# Patient Record
Sex: Female | Born: 1975
Health system: Southern US, Community
[De-identification: ages and names within clinical notes are randomized; demographics above are authoritative.]

## PROBLEM LIST (undated history)

## (undated) DIAGNOSIS — E119 Type 2 diabetes mellitus without complications: Secondary | ICD-10-CM

## (undated) HISTORY — PX: CHOLECYSTECTOMY: SHX55

## (undated) HISTORY — PX: TUBAL LIGATION: SHX77

---

## 1997-06-06 ENCOUNTER — Emergency Department (HOSPITAL_COMMUNITY): Admission: EM | Admit: 1997-06-06 | Discharge: 1997-06-06 | Payer: Self-pay | Admitting: Emergency Medicine

## 1997-08-14 ENCOUNTER — Emergency Department (HOSPITAL_COMMUNITY): Admission: EM | Admit: 1997-08-14 | Discharge: 1997-08-14 | Payer: Self-pay | Admitting: Emergency Medicine

## 1997-08-29 ENCOUNTER — Other Ambulatory Visit: Admission: RE | Admit: 1997-08-29 | Discharge: 1997-08-29 | Payer: Self-pay | Admitting: Obstetrics & Gynecology

## 1997-10-15 ENCOUNTER — Emergency Department (HOSPITAL_COMMUNITY): Admission: EM | Admit: 1997-10-15 | Discharge: 1997-10-15 | Payer: Self-pay | Admitting: Emergency Medicine

## 1997-10-15 ENCOUNTER — Encounter: Payer: Self-pay | Admitting: Emergency Medicine

## 1998-01-23 ENCOUNTER — Inpatient Hospital Stay (HOSPITAL_COMMUNITY): Admission: AD | Admit: 1998-01-23 | Discharge: 1998-01-23 | Payer: Self-pay | Admitting: Obstetrics and Gynecology

## 1998-02-26 ENCOUNTER — Inpatient Hospital Stay (HOSPITAL_COMMUNITY): Admission: AD | Admit: 1998-02-26 | Discharge: 1998-02-26 | Payer: Self-pay | Admitting: Obstetrics & Gynecology

## 1998-03-20 ENCOUNTER — Inpatient Hospital Stay (HOSPITAL_COMMUNITY): Admission: AD | Admit: 1998-03-20 | Discharge: 1998-03-20 | Payer: Self-pay | Admitting: Obstetrics and Gynecology

## 1998-03-23 ENCOUNTER — Inpatient Hospital Stay (HOSPITAL_COMMUNITY): Admission: AD | Admit: 1998-03-23 | Discharge: 1998-03-23 | Payer: Self-pay | Admitting: Obstetrics & Gynecology

## 1998-04-12 ENCOUNTER — Inpatient Hospital Stay (HOSPITAL_COMMUNITY): Admission: AD | Admit: 1998-04-12 | Discharge: 1998-04-12 | Payer: Self-pay | Admitting: Obstetrics & Gynecology

## 1998-04-18 ENCOUNTER — Inpatient Hospital Stay (HOSPITAL_COMMUNITY): Admission: AD | Admit: 1998-04-18 | Discharge: 1998-04-21 | Payer: Self-pay | Admitting: Obstetrics and Gynecology

## 1998-08-01 ENCOUNTER — Other Ambulatory Visit: Admission: RE | Admit: 1998-08-01 | Discharge: 1998-08-01 | Payer: Self-pay | Admitting: Obstetrics and Gynecology

## 1998-09-04 ENCOUNTER — Emergency Department (HOSPITAL_COMMUNITY): Admission: EM | Admit: 1998-09-04 | Discharge: 1998-09-04 | Payer: Self-pay | Admitting: Emergency Medicine

## 1998-12-18 ENCOUNTER — Inpatient Hospital Stay (HOSPITAL_COMMUNITY): Admission: AD | Admit: 1998-12-18 | Discharge: 1998-12-18 | Payer: Self-pay | Admitting: Obstetrics and Gynecology

## 1999-01-09 ENCOUNTER — Inpatient Hospital Stay (HOSPITAL_COMMUNITY): Admission: AD | Admit: 1999-01-09 | Discharge: 1999-01-09 | Payer: Self-pay | Admitting: Obstetrics & Gynecology

## 1999-01-20 ENCOUNTER — Inpatient Hospital Stay (HOSPITAL_COMMUNITY): Admission: AD | Admit: 1999-01-20 | Discharge: 1999-01-28 | Payer: Self-pay | Admitting: Obstetrics and Gynecology

## 1999-01-20 ENCOUNTER — Encounter (INDEPENDENT_AMBULATORY_CARE_PROVIDER_SITE_OTHER): Payer: Self-pay | Admitting: Specialist

## 1999-01-21 ENCOUNTER — Encounter: Payer: Self-pay | Admitting: Obstetrics and Gynecology

## 1999-01-31 ENCOUNTER — Inpatient Hospital Stay (HOSPITAL_COMMUNITY): Admission: AD | Admit: 1999-01-31 | Discharge: 1999-01-31 | Payer: Self-pay | Admitting: Obstetrics & Gynecology

## 2000-07-27 ENCOUNTER — Emergency Department (HOSPITAL_COMMUNITY): Admission: EM | Admit: 2000-07-27 | Discharge: 2000-07-27 | Payer: Self-pay | Admitting: Emergency Medicine

## 2000-08-26 ENCOUNTER — Emergency Department (HOSPITAL_COMMUNITY): Admission: EM | Admit: 2000-08-26 | Discharge: 2000-08-26 | Payer: Self-pay

## 2000-10-26 ENCOUNTER — Other Ambulatory Visit: Admission: RE | Admit: 2000-10-26 | Discharge: 2000-10-26 | Payer: Self-pay | Admitting: Obstetrics & Gynecology

## 2000-12-05 ENCOUNTER — Inpatient Hospital Stay (HOSPITAL_COMMUNITY): Admission: AD | Admit: 2000-12-05 | Discharge: 2000-12-05 | Payer: Self-pay | Admitting: Obstetrics and Gynecology

## 2001-02-01 ENCOUNTER — Inpatient Hospital Stay (HOSPITAL_COMMUNITY): Admission: AD | Admit: 2001-02-01 | Discharge: 2001-02-01 | Payer: Self-pay | Admitting: Obstetrics and Gynecology

## 2001-03-24 ENCOUNTER — Emergency Department (HOSPITAL_COMMUNITY): Admission: EM | Admit: 2001-03-24 | Discharge: 2001-03-24 | Payer: Self-pay | Admitting: Emergency Medicine

## 2001-04-04 ENCOUNTER — Inpatient Hospital Stay (HOSPITAL_COMMUNITY): Admission: AD | Admit: 2001-04-04 | Discharge: 2001-04-04 | Payer: Self-pay | Admitting: Obstetrics and Gynecology

## 2001-04-20 ENCOUNTER — Encounter: Payer: Self-pay | Admitting: Obstetrics and Gynecology

## 2001-04-20 ENCOUNTER — Ambulatory Visit (HOSPITAL_COMMUNITY): Admission: RE | Admit: 2001-04-20 | Discharge: 2001-04-20 | Payer: Self-pay | Admitting: Obstetrics and Gynecology

## 2001-04-23 ENCOUNTER — Inpatient Hospital Stay (HOSPITAL_COMMUNITY): Admission: AD | Admit: 2001-04-23 | Discharge: 2001-04-25 | Payer: Self-pay | Admitting: Obstetrics & Gynecology

## 2001-04-25 ENCOUNTER — Encounter: Payer: Self-pay | Admitting: Obstetrics & Gynecology

## 2001-04-28 ENCOUNTER — Inpatient Hospital Stay (HOSPITAL_COMMUNITY): Admission: AD | Admit: 2001-04-28 | Discharge: 2001-04-28 | Payer: Self-pay | Admitting: *Deleted

## 2001-05-06 ENCOUNTER — Inpatient Hospital Stay (HOSPITAL_COMMUNITY): Admission: AD | Admit: 2001-05-06 | Discharge: 2001-05-06 | Payer: Self-pay | Admitting: Obstetrics and Gynecology

## 2001-05-06 ENCOUNTER — Encounter: Payer: Self-pay | Admitting: Obstetrics and Gynecology

## 2001-05-15 ENCOUNTER — Inpatient Hospital Stay (HOSPITAL_COMMUNITY): Admission: AD | Admit: 2001-05-15 | Discharge: 2001-05-15 | Payer: Self-pay | Admitting: Obstetrics & Gynecology

## 2001-05-21 ENCOUNTER — Inpatient Hospital Stay: Admission: AD | Admit: 2001-05-21 | Discharge: 2001-05-21 | Payer: Self-pay | Admitting: Obstetrics and Gynecology

## 2001-06-04 ENCOUNTER — Inpatient Hospital Stay (HOSPITAL_COMMUNITY): Admission: AD | Admit: 2001-06-04 | Discharge: 2001-06-04 | Payer: Self-pay | Admitting: Obstetrics and Gynecology

## 2001-06-06 ENCOUNTER — Inpatient Hospital Stay (HOSPITAL_COMMUNITY): Admission: AD | Admit: 2001-06-06 | Discharge: 2001-06-09 | Payer: Self-pay | Admitting: Obstetrics and Gynecology

## 2001-06-06 ENCOUNTER — Encounter (INDEPENDENT_AMBULATORY_CARE_PROVIDER_SITE_OTHER): Payer: Self-pay

## 2002-01-16 ENCOUNTER — Inpatient Hospital Stay (HOSPITAL_COMMUNITY): Admission: AD | Admit: 2002-01-16 | Discharge: 2002-01-16 | Payer: Self-pay | Admitting: *Deleted

## 2002-08-22 ENCOUNTER — Emergency Department (HOSPITAL_COMMUNITY): Admission: EM | Admit: 2002-08-22 | Discharge: 2002-08-22 | Payer: Self-pay | Admitting: Emergency Medicine

## 2003-04-16 ENCOUNTER — Inpatient Hospital Stay (HOSPITAL_COMMUNITY): Admission: AD | Admit: 2003-04-16 | Discharge: 2003-04-16 | Payer: Self-pay | Admitting: Obstetrics & Gynecology

## 2003-05-01 ENCOUNTER — Encounter: Admission: RE | Admit: 2003-05-01 | Discharge: 2003-05-01 | Payer: Self-pay | Admitting: Obstetrics and Gynecology

## 2003-05-25 ENCOUNTER — Emergency Department (HOSPITAL_COMMUNITY): Admission: EM | Admit: 2003-05-25 | Discharge: 2003-05-25 | Payer: Self-pay | Admitting: Emergency Medicine

## 2004-04-27 ENCOUNTER — Emergency Department (HOSPITAL_COMMUNITY): Admission: EM | Admit: 2004-04-27 | Discharge: 2004-04-27 | Payer: Self-pay | Admitting: *Deleted

## 2004-04-27 ENCOUNTER — Emergency Department (HOSPITAL_COMMUNITY): Admission: EM | Admit: 2004-04-27 | Discharge: 2004-04-27 | Payer: Self-pay | Admitting: Emergency Medicine

## 2004-05-12 ENCOUNTER — Inpatient Hospital Stay (HOSPITAL_COMMUNITY): Admission: AD | Admit: 2004-05-12 | Discharge: 2004-05-12 | Payer: Self-pay | Admitting: Obstetrics & Gynecology

## 2005-07-12 ENCOUNTER — Emergency Department (HOSPITAL_COMMUNITY): Admission: EM | Admit: 2005-07-12 | Discharge: 2005-07-12 | Payer: Self-pay | Admitting: Emergency Medicine

## 2006-06-28 ENCOUNTER — Emergency Department (HOSPITAL_COMMUNITY): Admission: EM | Admit: 2006-06-28 | Discharge: 2006-06-28 | Payer: Self-pay | Admitting: Emergency Medicine

## 2006-07-04 ENCOUNTER — Emergency Department (HOSPITAL_COMMUNITY): Admission: EM | Admit: 2006-07-04 | Discharge: 2006-07-04 | Payer: Self-pay | Admitting: Emergency Medicine

## 2006-07-07 ENCOUNTER — Emergency Department (HOSPITAL_COMMUNITY): Admission: EM | Admit: 2006-07-07 | Discharge: 2006-07-07 | Payer: Self-pay | Admitting: Family Medicine

## 2007-01-20 ENCOUNTER — Emergency Department (HOSPITAL_COMMUNITY): Admission: EM | Admit: 2007-01-20 | Discharge: 2007-01-20 | Payer: Self-pay | Admitting: Family Medicine

## 2008-02-21 ENCOUNTER — Emergency Department: Payer: Self-pay | Admitting: Emergency Medicine

## 2008-03-22 ENCOUNTER — Emergency Department: Payer: Self-pay | Admitting: Emergency Medicine

## 2008-04-17 ENCOUNTER — Ambulatory Visit: Payer: Self-pay | Admitting: General Surgery

## 2008-05-18 ENCOUNTER — Emergency Department: Payer: Self-pay | Admitting: Emergency Medicine

## 2008-06-12 ENCOUNTER — Ambulatory Visit: Payer: Self-pay | Admitting: General Surgery

## 2010-05-23 NOTE — Discharge Summary (Signed)
[facility]  Patient:    Laura Mitchell                        MRN: [id number] Adm. Date:  [id number] Disc. Date: [id number] Attending:  Marcelle Overlie Dictator:   Leilani Able, P.A.                           Discharge Summary  FINAL DIAGNOSES:              1. Intrauterine pregnancy at 32-4/7 weeks.                               2. Preterm premature rupture of membranes.                               3. History of previous cesarean section, patient                                  desires repeat cesarean section.  PROCEDURE:                    Repeat low transverse cesarean section.  SURGEON:                      Marcelle Overlie, M.D.  COMPLICATIONS:                None.  HISTORY OF PRESENT ILLNESS:   This 35 year old G2, P1-0-0-1, presents at around 32+ weeks with a history of one day leaking some clear fluid with on and off back pain. Patients prenatal course has been uncomplicated.  Patient has a history of cesarean section with her last pregnancy, secondary to failure to progress. The patient desires a repeat cesarean section with this pregnancy.  Patient is admitted at this time to the antenatal unit, started on IV antibiotics, steroid, and magnesium sulfate.  Patient has intermittent back pain.  HOSPITAL COURSE:              Patient continues to leak fluid.  On January 20, patient is noted to have some increase in her contractions, some maternal tachycardia, and possible early chorioamnionitis.  Because of these complications it was felt we should proceed with cesarean section.  Patient was taken to the operating room by Dr. Marcelle Overlie, where a repeat low transverse cesarean section was performed with the delivery of a 4 pound 11.2 ounce female infant with Apgars of 6 and 7.  Procedure went without complications.  The baby was sent to the NICU but was stable.  POSTOPERATIVE COURSE:         Patients postoperative course was  benign without significant fevers.  Patient was felt ready for discharge on postoperative day 3. She was sent home on a regular diet, told to decrease activities, told to continue prenatal vitamins and FESO4.  She was given Tylox 1-2 every four hours as needed for pain and told to follow up in the office in four weeks.  Upon discharge baby was stable in the NICU. DD:  02/14/99 TD:  02/16/99 Job: [id number]

## 2010-05-23 NOTE — Op Note (Signed)
[facility]  Patient:    Laura Mitchell, Laura Mitchell Visit Number: [id number] MRN: [id number]          Service Type: OBS Location: *N Attending Physician:  Melony Overly Dictated by:   Gerrit Friends. Aldona Bar, M.D. Proc. Date: 06/06/01                             Operative Report  PREOPERATIVE DIAGNOSES:       A 37-1/2 to 38-1/2 week pregnancy, spontaneous rupture of membranes x8-10 hours, previous cesarean section x2, desire for sterilization and repeat cesarean section.  POSTOPERATIVE DIAGNOSES:      A 37-1/2 to 38-1/2 week pregnancy, spontaneous rupture of membranes x8-10 hours, previous cesarean section x2, desire for sterilization and repeat cesarean section, delivery of 8 pound 4 ounce female infant, Apgars 8 and 9.  Pathology pending on a segment of each fallopian tube.  PROCEDURE:                    Repeat low transverse cesarean section and tubal sterilization.  SURGEON:                      Gerrit Friends. Aldona Bar, M.D.  ANESTHESIA:                   Epidural.  HISTORY:                      This 35 year old gravida 3, para 2 who has had two previous cesarean sections and was originally scheduled for June 12 with dates that puts her currently at between 37-1/2 and 38-1/2 weeks.  Presented to triage with a questionable history of spontaneous rupture of membranes for about 8-10 hours.  On examination by me in triage there was a small amount of Nitrazine positive fluid coming from the cervix and the cervix was 2 cm dilated with a vertex floating.  Baby weighed at least 7.5 pounds by abdominal measurements and fetal heart was reactive.  Because of the likely possibility of spontaneous rupture of membranes for eight to 10 hours, decision was made to proceed with delivery by repeat cesarean section and patient had also requested a permanent elective sterilization procedure.  She is taken to the operating room now for delivery.  Patient was taken to the operating  room where Dr. Harvest Forest placed a spinal anesthetic without difficulty.  Thereafter the patient was positioned, prepped and draped with a Foley catheter inserted as part of the prep.  Once the patient had been adequately draped, anesthetic levels were checked and noted to be adequate.  At this time Pfannenstiel incision was made and with minimal difficulty dissected down sharply to and through the fascia in a low transverse fashion. Muscles were separated in the midline after the subfascial space was created inferiorly and superiorly and peritoneum was identified and entered appropriately with care taken to avoid the bowel superiorly and the bladder inferiorly.  At this time with good exposure the vesicouterine peritoneum was incised in a low transverse fashion and pushed off the lower uterine segment with ease.  Sharp incision of the lower segment of the uterus was then carried out with the Metzenbaum scissors, extended laterally with the fingers.  The lower segment naturally was quite thin.  There was a fair amount of amniotic fluid still remaining which was clear. With the aid of the vacuum extractor delivery of an  8 pound 4 ounce female infant was carried out without difficulty.  Apgars were noted to be 8 and 9.  After the infant was delivered the cord was clamped and cut and the infant was passed off to the awaiting team and thereafter taken to the nursery in good condition.  After cord bloods were delivered, placenta was delivered intact.  The uterus was then exteriorized, manually rendered free of any remaining products of conception and thereafter the uterus was closed in layers, the first layer being #1 Vicryl in a running locking fashion and several figure-of-eight #1 Vicryl sutures placed in a figure-of-eight fashion for additional hemostasis. At this time the uterine incision was noted to be dry.  Attention was turned to the tubes and ovaries.  Both tubes and ovaries appeared  completely normal. The right fallopian tube was identified, traced out to the fimbriated end for positive identification.  Then, in the mid portion of the right fallopian tube which was elevated, a knuckle was created and a free tie of #1 plain catgut suture tied about the knuckle and the knuckle excised and sent to pathology with hemostasis being adequate.  Similarly, the left fallopian tube underwent a Pomeroy tubal sterilization procedure.  Hemostasis was adequate there as well.  The uterine incision was reinspected and noted to be dry.  The uterus was well contracted.  Both tubal sterilization sites were dry and the uterus this time was replaced into the abdomen.  Abdomen lavaged of all free blood and clot and then closure of the abdomen was begun in layers.  At this time no foreign bodies were noted to be remaining in the abdominal cavity and all counts were noted to be correct.  The abdominoperitoneum was closed with 0 Vicryl in a running fashion and muscles secured with same.  Care was taken to avoid the bowel superiorly and the bladder inferiorly.  At this time with good subfascial hemostasis noted, closure of the fascia was carried out with 0 Vicryl from angle to midline bilaterally.  Subcutaneous tissue was then rendered hemostatic and staples were then used to close the skin.  A sterile pressure dressing was applied and the patient at this time was transported to the recovery room in satisfactory condition having tolerated procedure well.  ESTIMATED BLOOD LOSS:         500 cc.  COUNTS:                       All counts correct x2.  PATHOLOGIC SPECIMEN:          Consisted of segment of each fallopian tube.  SUMMARY:                      This patient presented to triage with a likely history of ruptured membranes for 8-10 hours at 37-1/2 to 38-1/2 weeks of pregnancy.  She was scheduled for repeat cesarean section on June 12.  She had had two previous cesarean sections and also  desired a tubal sterilization procedure.  On evaluation in triage it was possible, indeed likely, that her  membranes did have a small leak and she was taken to the operating room for delivery by cesarean section with delivery of an 8 pound 4 ounce female infant with Apgars of 8 and 9 and tubal sterilization procedure was carried out as well.  At the conclusion of the procedure both mother and baby were doing well in their respective recovery areas. Dictated by:  Gerrit Friends. Aldona Bar, M.D. Attending Physician:  Melony Overly DD:  06/06/01 TD:  06/07/01 Job: [phone] [id number]

## 2010-05-23 NOTE — Discharge Summary (Signed)
[facility]  Patient:    Laura Mitchell, Laura Mitchell Visit Number: [id number] MRN: [id number]          Service Type: OBS Location: 910A 9130 01 Attending Physician:  Miguel Aschoff Dictated by:   Leilani Able, P.A. Admit Date:  06/06/2001 Discharge Date: 06/09/2001                             Discharge Summary  FINAL DIAGNOSES:              1. A 37-1/2 to 38-1/2 week pregnancy.                               2. Spontaneous rupture of membranes x8-10 hours.                               3. History of previous cesarean section x2 and                                  desire for repeat cesarean section and                                  postpartum sterilization.  PROCEDURE:                    Repeat low transverse cesarean section and tubal sterilization.  SURGEON:                      Gerrit Friends. Aldona Bar, M.D.  COMPLICATIONS:                None.  HOSPITAL COURSE:              This 35 year old G3, P2 presented at 53 to 38-1/[redacted] weeks gestation with spontaneous rupture of membrane.  The patient has had two previous cesarean sections and was scheduled for a third cesarean section on June 12.  The patient had rupture of membranes for about 8-10 hours when she presented to triage.  At this point, decision was made to proceed with her repeat cesarean section and the patient also requested a permanent elective sterilization procedure.  She was taken to the operating room on June 06, 2001 by Dr. Annamaria Helling, where a repeat low transverse cesarean section was performed with the delivery of an 8 pound 4 ounce female infant with Apgars of 8 and 9.  Delivery went without complications.  The patients postoperative course was benign without significant fevers.  The patient did have some postoperative anemia.  She was felt ready for discharge on postoperative day #3.  She was sent home on a regular diet, told to decrease activities, told to continue prenatal vitamins and  FeSO4.  She was given a prescription for Percocet 1-2 every four hours as needed for pain and told to follow up in the office in four weeks. Dictated by:   Leilani Able, P.A. Attending Physician:  Miguel Aschoff DD:  06/27/01 TD:  07/02/01 Job: [id number]

## 2010-05-23 NOTE — Op Note (Signed)
[facility]  Patient:    Laura Mitchell                        MRN: [id number] Proc. Date: 01/25/99 Adm. Date:  [id number] Attending:  Marcelle Overlie                           Operative Report  PREOPERATIVE DIAGNOSIS: 1. Intrauterine pregnancy 33 and 2/7 weeks. 2. Previous cesarean section desires repeat cesarean section. 3. P-prime. 4. Chorioamnionitis.  POSTOPERATIVE DIAGNOSIS: 1. Intrauterine pregnancy 33 and 2/7 weeks. 2. Previous cesarean section desires repeat cesarean section. 3. P-prime. 4. Chorioamnionitis.  OPERATION:  Repeat low transverse cesarean section.  SURGEON:  Marcelle Overlie, M.D.  ANESTHESIA:  Spinal.  ESTIMATED BLOOD LOSS:  500 cc.  FINDINGS:  Vigorous female, cephalic presentation, birth weight is pending. Apgars 6 at one minute and 7 at five minutes.  DESCRIPTION OF PROCEDURE:  The patient was taken to the operating room.  She was then given a spinal without difficulty.  She was then placed in supine position on the operating room table.  The abdomen was prepped and draped in the usual sterile fashion.  Using the scalpel, a low transverse incision was made in the area of he previous incision and carried down to the fascia using electrocautery.  The fascia was scored in the midline and extended laterally using Mayo scissors.  A Pfannenstiel incision was created by dissecting the rectus muscles away superiorly and then inferiorly with good visualization and good hemostasis.  The midline was identified.  The rectus muscles were separated.  The peritoneum was entered bluntly.  There were noted to be no adhesions.  The peritoneal incision was then extended superiorly and then inferiorly with good visualization of the bladder.  The bladder blade was inserted and the lower uterine segment was identified and the bladder flap was created sharply and then digitally.  The bladder blade was then readjusted.  Using  a scalpel, a low transverse incision was made in the uterus nd extended laterally using bandage scissors.  There was clear amniotic fluid noted at the time of delivery. The baby was in cephalic presentation and was delivered without difficulty, was a female who was very vigorous on the abdomen with Apgars 6 at one minute and 7 at five minutes.  The cord was clamped and cut and the baby was handed to the awaiting pediatricians.  The placenta was manually removed after ord blood was obtained and then sent for analysis.  The uterus was then noted to be  contracted well and the uterine incision was then closed in a single layer using 0 chromic continuous running locked stitch.  It was inspected and noted to be hemostatic.  The peritoneum was closed using 0 Vicryl in continuous running stitch and the rectus muscles were reapproximated using the same 0 Vicryl.  The fascia was closed using 0 Vicryl suture in a continuous running stitch starting each corner meeting in the midline.  After irrigation of the subcutaneous layers it was noted to be hemostatic.  The skin was closed with staples.  All sponge, lap and instrument counts were correct x 2.  The patient tolerated the procedure well and went to the recovery room in stable condition.  COMPLICATIONS:  None.  PATHOLOGY:  Placenta. DD:  01/25/99 TD:  01/28/99 Job: [id number]

## 2010-05-23 NOTE — Group Therapy Note (Signed)
NAME:  Laura Mitchell, Laura Mitchell NO.:  [id number]   MEDICAL RECORD NO.:  [id number]                   PATIENT TYPE:  OUT   LOCATION:  [facility]                           FACILITY:  [facility]   PHYSICIAN:  Argentina Donovan, MD                     DATE OF BIRTH:  1975-05-19   DATE OF SERVICE:  05/01/2003                                    CLINIC NOTE   REASON FOR VISIT:  The patient is a 35 year old white female gravida 3 para  3-0-0-3 with a history of three cesarean sections and a bilateral tubal  ligation following last baby 2 years ago.  She was completely normal with  the exception of her periods being heavy since the tubal ligation but she  was on birth control pills to control her fertility prior to the birth of  her last baby.  The patient weighs 234 pounds and went into the MAU on April 16, 2003 complaining of severe lower abdominal pain with abnormal bleeding.  Her period had been 1 week later than usual and it had been short-lived,  lasting only 3 days when nowadays it is about 7.  Since that time she has  had intermittent pains, sharp pains that would come and go, especially in  the left adnexal area, but the bleeding has stopped.   EXAMINATION:  Her abdomen is soft, flat, nontender, with no masses, no  organomegaly, although markedly obese.  Genital examination:  External  genitalia is normal.  The vagina is clean and well rugated.  The cervix is  clean and nulliparous and a Pap smear was taken.  The uterus and adnexa  could not be outlined but the patient had definite tenderness in the left  adnexal area to palpation inside the vagina.   IMPRESSION:  Pelvic pain of unknown origin, perhaps a small ruptured ovarian  cyst.  The patient had a sonogram which was completely normal when she was  in to the MAU; also a negative pregnancy test and a negative GC/chlamydia  culture.  I told the patient we would observe what is going on, that I was  going to give her  some Ovcon-35 two samples to start taking if the pain  persisted up until the onset of her next period.  If it did not, I would  hold onto them and not take them and just observe what happened over the  next couple of cycles, and the patient will let us know if there is a  problem.                                               Argentina Donovan, MD    PR/MEDQ  D:  05/01/2003  T:  05/01/2003  Job:  [id number]

## 2010-05-23 NOTE — Discharge Summary (Signed)
[facility]  Patient:    Laura Mitchell, Laura Mitchell Visit Number: [id number] MRN: [id number]          Service Type: OBS Location: [facility] Attending Physician:  Lars Pinks Dictated by:   Leilani Able, P.A. Admit Date:  05/15/2001 Discharge Date: 05/15/2001                             Discharge Summary  FINAL DIAGNOSES:              1. Intrauterine pregnancy at [redacted] weeks                                  gestation.                               2. Preterm labor.                               3. History of prolonged premature rupture of                                  membranes at 32 weeks.  HISTORY OF PRESENT ILLNESS:   This 35 year old G3, P2, status post two cesarean sections, was admitted on April 19 for preterm labor.  The patient presented to triage and complained of low back pain.  Two doses of subcu terbutaline did not work, and at this point the patient was admitted for magnesium sulfate and Unasyn.  HOSPITAL COURSE:              The patient did well on the magnesium sulfate. There were rare contractions.  She was stable and on hospital day #3 she was felt ready for discharge.  The patients cervix had not changed.  It was just fingertip dilated, 30% effaced, and a high vertex.  The patients cervix did not change throughout her hospital course.  The contractions stopped, and an ultrasound had been performed in the hospital that showed a baby in the 98th percentile for weight for 31 weeks.  At this point the patient was discharged on oral Procardia, was told to continue her rest, and was to follow up in the office in one week or to call if contractions returned. Dictated by:   Leilani Able, P.A. Attending Physician:  Lars Pinks DD:  05/23/01 TD:  05/25/01 Job: [id number]

## 2010-10-21 LAB — POCT RAPID STREP A: Streptococcus, Group A Screen (Direct): POSITIVE — AB

## 2010-12-19 IMAGING — CR DG ANKLE COMPLETE 3+V*L*
1 series · 5 of 5 positions shown · non-contrast
Comparison: none

REASON FOR EXAM: pain and swelling s/p fall -- ER WR
COMMENTS:

[Series 1: view not recorded · 0.17mm/px · 5 of 5 slices shown]
[im 1/5]
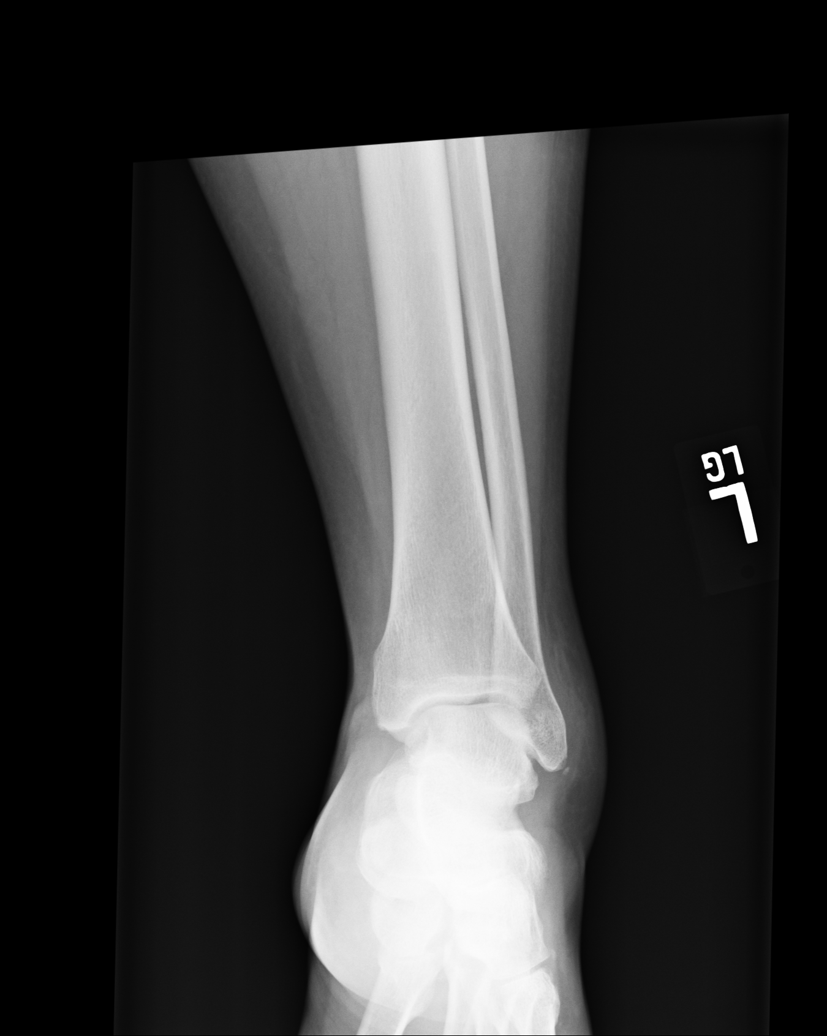
[im 2/5]
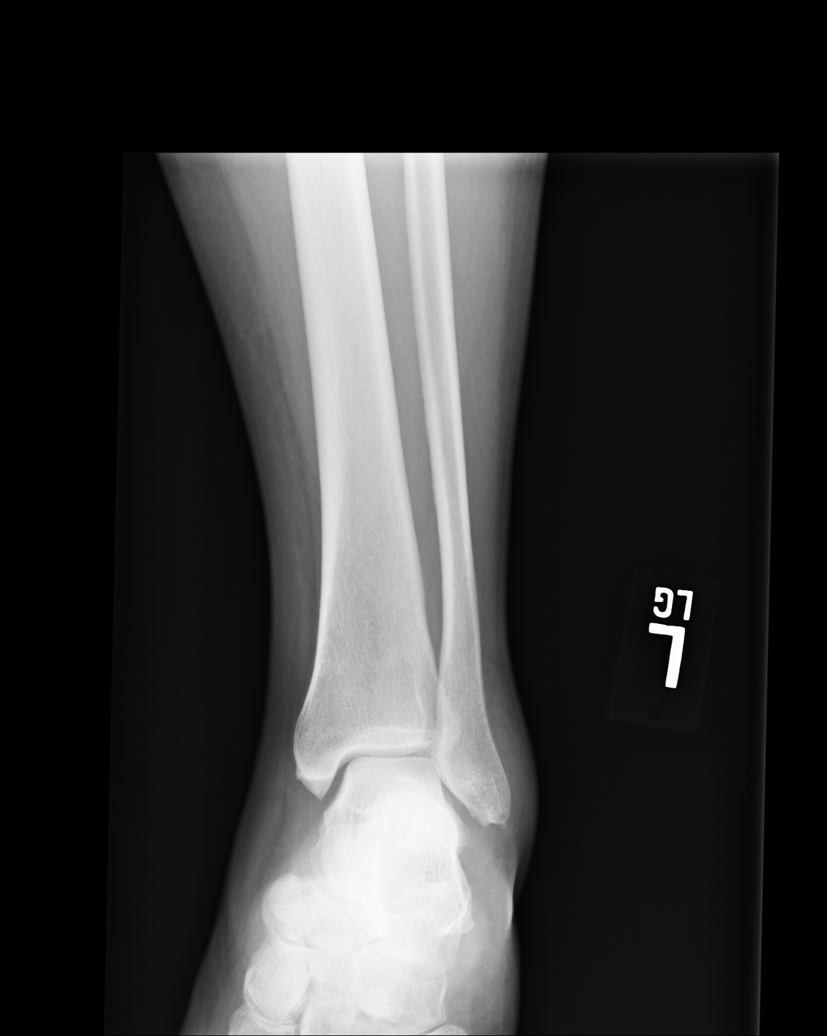
[im 3/5]
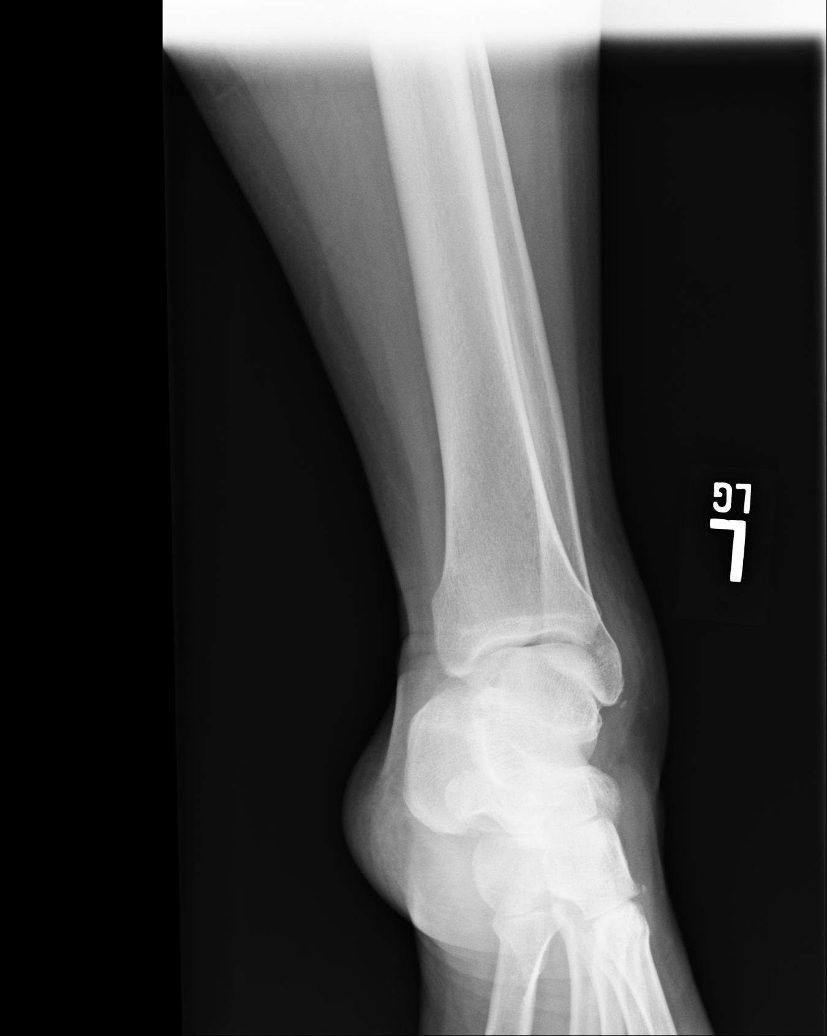
[im 4/5]
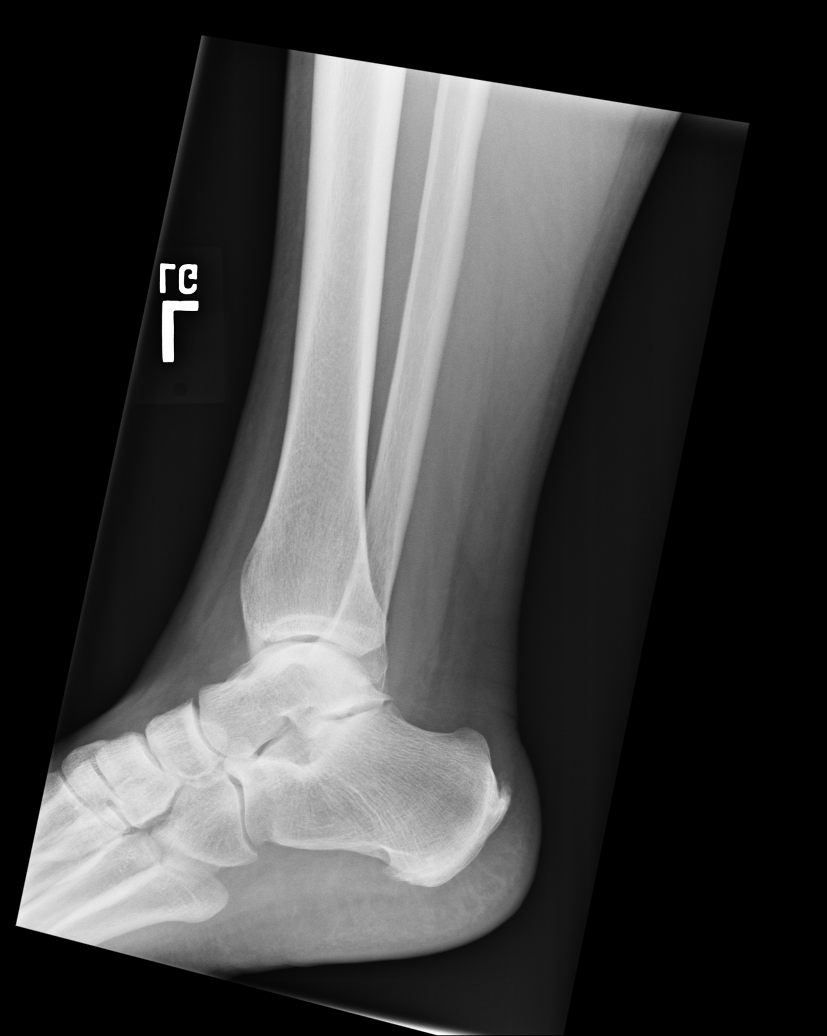
[im 5/5]
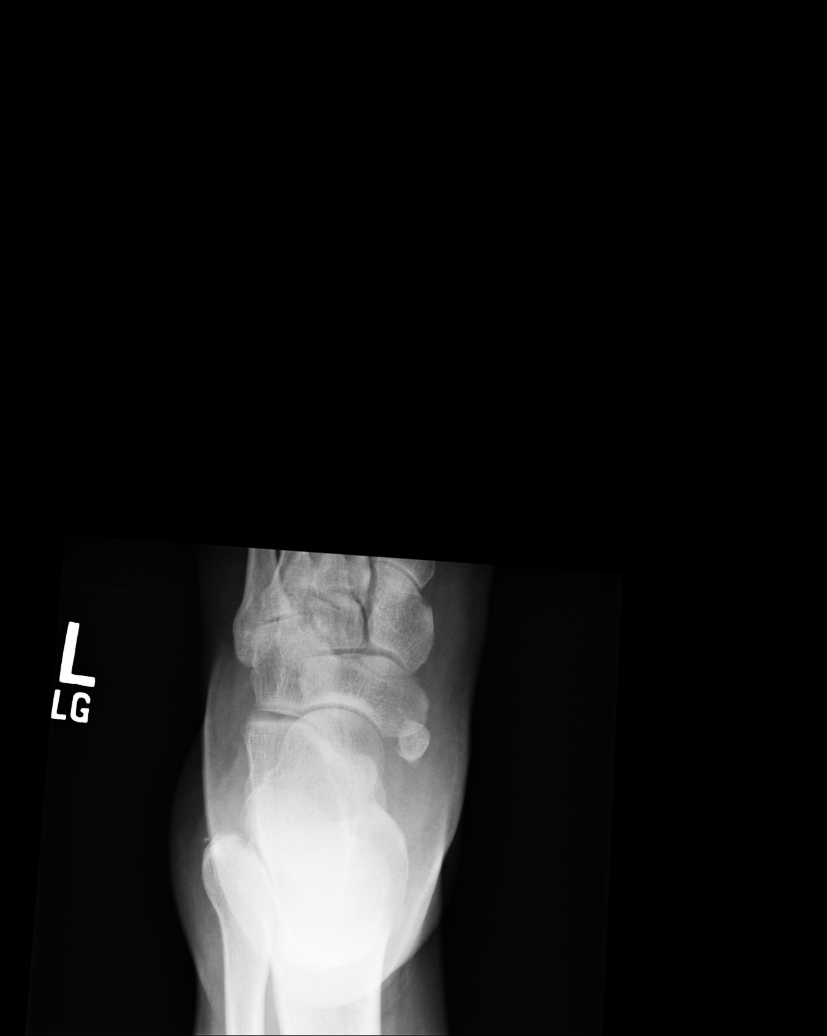

[5 of 5 positions shown; findings below may reference images not displayed]

PROCEDURE:     DXR - DXR ANKLE LEFT COMPLETE  - May 18, 2008 [DATE]

RESULT:     There is soft tissue swelling over the lateral malleolus. A tiny
bony density adjacent to the tip of the lateral malleolus likely reflects a
tiny avulsion fracture fragment. The medial and posterior malleoli are
grossly intact. The ankle joint mortise is preserved. There are Achilles and
plantar calcaneal spurs present.
IMPRESSION: There is likely a tiny avulsion from the tip of the lateral
malleolus. There is overlying soft tissue swelling.

## 2017-04-02 ENCOUNTER — Encounter: Payer: Self-pay | Admitting: Emergency Medicine

## 2017-04-02 ENCOUNTER — Emergency Department: Payer: BLUE CROSS/BLUE SHIELD

## 2017-04-02 ENCOUNTER — Emergency Department
Admission: EM | Admit: 2017-04-02 | Discharge: 2017-04-02 | Disposition: A | Discharge status: Home / Self Care | Payer: BLUE CROSS/BLUE SHIELD | Attending: Emergency Medicine | Admitting: Emergency Medicine

## 2017-04-02 DIAGNOSIS — R69 Illness, unspecified: Secondary | ICD-10-CM

## 2017-04-02 DIAGNOSIS — J111 Influenza due to unidentified influenza virus with other respiratory manifestations: Secondary | ICD-10-CM | POA: Diagnosis not present

## 2017-04-02 DIAGNOSIS — R05 Cough: Secondary | ICD-10-CM | POA: Diagnosis present

## 2017-04-02 DIAGNOSIS — E119 Type 2 diabetes mellitus without complications: Secondary | ICD-10-CM | POA: Diagnosis not present

## 2017-04-02 HISTORY — DX: Type 2 diabetes mellitus without complications: E11.9

## 2017-04-02 MED ORDER — OSELTAMIVIR PHOSPHATE 75 MG PO CAPS: 75.0000 mg | ORAL_CAPSULE | Freq: Two times a day (BID) | ORAL | 0 refills | Status: AC

## 2017-04-02 MED ORDER — BENZONATATE 100 MG PO CAPS: 100.0000 mg | ORAL_CAPSULE | Freq: Three times a day (TID) | ORAL | 0 refills | Status: AC | PRN

## 2017-04-02 NOTE — ED Provider Notes (Signed)
[facility] Emergency Department Provider Note  ____________________________________________  Time seen: Approximately 9:47 PM  I have reviewed the triage vital signs and the nursing notes.   HISTORY  Chief Complaint Cough and Fever    HPI Laura Mitchell is a 42 y.o. female presents to the emergency department with rhinorrhea, congestion, nonproductive cough, body aches and fever that started yesterday.  Patient currently works at [employer] and has numerous sick contacts.  She denies chest pain, chest tightness, shortness of breath, nausea, vomiting and abdominal pain.  No diarrhea.  No emesis.  No alleviating measures have been attempted.   Past Medical History:  Diagnosis Date  . Diabetes mellitus without complication (HCC)     There are no active problems to display for this patient.   Past Surgical History:  Procedure Laterality Date  . CESAREAN SECTION    . CHOLECYSTECTOMY      Prior to Admission medications   Medication Sig Start Date End Date Taking? Authorizing Provider  benzonatate (TESSALON PERLES) 100 MG capsule Take 1 capsule (100 mg total) by mouth 3 (three) times daily as needed for up to 7 days for cough. 04/02/17 04/09/17  Orvil Feil, PA-C  oseltamivir (TAMIFLU) 75 MG capsule Take 1 capsule (75 mg total) by mouth 2 (two) times daily for 5 days. 04/02/17 04/07/17  Orvil Feil, PA-C    Allergies Patient has no known allergies.  No family history on file.  Social History Social History   Tobacco Use  . Smoking status: Never Smoker  . Smokeless tobacco: Never Used  Substance Use Topics  . Alcohol use: Not Currently  . Drug use: Not Currently      Review of Systems  Constitutional: Patient has fever.  Eyes: No visual changes. No discharge ENT: Patient has congestion.  Cardiovascular: no chest pain. Respiratory: Patient has cough.  Gastrointestinal: No abdominal pain.  No nausea, no vomiting. Patient had diarrhea.   Genitourinary: Negative for dysuria. No hematuria Musculoskeletal: Patient has myalgias.  Skin: Negative for rash, abrasions, lacerations, ecchymosis. Neurological: Patient has headache, no focal weakness or numbness.     ____________________________________________   PHYSICAL EXAM:  VITAL SIGNS: ED Triage Vitals  Enc Vitals Group     BP 04/02/17 1936 135/73     Pulse Rate 04/02/17 1936 (!) 101     Resp 04/02/17 1936 20     Temp 04/02/17 1936 100.3 F (37.9 C)     Temp Source 04/02/17 1936 Oral     SpO2 04/02/17 1936 96 %     Weight 04/02/17 1937 180 lb (81.6 kg)     Height 04/02/17 1937 5\' 2"  (1.575 m)     Head Circumference --      Peak Flow --      Pain Score 04/02/17 1936 8     Pain Loc --      Pain Edu? --      Excl. in GC? --      Constitutional: Alert and oriented. Patient is lying supine. Eyes: Conjunctivae are normal. PERRL. EOMI. Head: Atraumatic. ENT:      Ears: Tympanic membranes are mildly injected with mild effusion bilaterally.       Nose: No congestion/rhinnorhea.      Mouth/Throat: Mucous membranes are moist. Posterior pharynx is mildly erythematous.  Hematological/Lymphatic/Immunilogical: No cervical lymphadenopathy.  Cardiovascular: Normal rate, regular rhythm. Normal S1 and S2.  Good peripheral circulation. Respiratory: Normal respiratory effort without tachypnea or retractions. Lungs CTAB. Good air entry  to the bases with no decreased or absent breath sounds. Gastrointestinal: Bowel sounds 4 quadrants. Soft and nontender to palpation. No guarding or rigidity. No palpable masses. No distention. No CVA tenderness. Musculoskeletal: Full range of motion to all extremities. No gross deformities appreciated. Neurologic:  Normal speech and language. No gross focal neurologic deficits are appreciated.  Skin:  Skin is warm, dry and intact. No rash noted. Psychiatric: Mood and affect are normal. Speech and behavior are normal. Patient exhibits appropriate  insight and judgement.   ____________________________________________   LABS (all labs ordered are listed, but only abnormal results are displayed)  Labs Reviewed - No data to display ____________________________________________  EKG   ____________________________________________  RADIOLOGY Geraldo PitterI, Ruchama Kubicek M Roniesha Hollingshead, personally viewed and evaluated these images (plain radiographs) as part of my medical decision making, as well as reviewing the written report by the radiologist.  Dg Chest 2 View  Result Date: 04/02/2017 CLINICAL DATA:  Cough and fever EXAM: CHEST - 2 VIEW COMPARISON:  02/21/2008 FINDINGS: The heart size and mediastinal contours are within normal limits. Both lungs are clear. The visualized skeletal structures are unremarkable. IMPRESSION: No active cardiopulmonary disease. Electronically Signed   By: Deatra RobinsonKevin  Herman M.D.   On: 04/02/2017 20:08    ____________________________________________    PROCEDURES  Procedure(s) performed:    Procedures    Medications - No data to display   ____________________________________________   INITIAL IMPRESSION / ASSESSMENT AND PLAN / ED COURSE  Pertinent labs & imaging results that were available during my care of the patient were reviewed by me and considered in my medical decision making (see chart for details).  Review of the [place] CSRS was performed in accordance of the [facility] prior to dispensing any controlled drugs.    Assessment and plan Influenza-like illness Patient presents to the emergency department with rhinorrhea, congestion, nonproductive cough, fever and body aches.  Differential diagnosis originally included influenza-like illness versus unspecified viral URI.  History and physical exam findings are consistent with influenza-like illness at this time.  Patient was discharged with Tamiflu.  Rest and hydration were encouraged.  Tylenol and ibuprofen alternating for fever were recommended.  All patient questions  were answered.     ____________________________________________  FINAL CLINICAL IMPRESSION(S) / ED DIAGNOSES  Final diagnoses:  Influenza-like illness      NEW MEDICATIONS STARTED DURING THIS VISIT:  ED Discharge Orders        Ordered    oseltamivir (TAMIFLU) 75 MG capsule  2 times daily     04/02/17 2110    benzonatate (TESSALON PERLES) 100 MG capsule  3 times daily PRN     04/02/17 2110          This chart was dictated using voice recognition software/Dragon. Despite best efforts to proofread, errors can occur which can change the meaning. Any change was purely unintentional.    Gasper LloydWoods, Keshun Berrett M, PA-C 04/02/17 2149    Jene EveryKinner, Robert, MD 04/02/17 2227

## 2017-04-02 NOTE — ED Notes (Signed)
Pt ambulatory upon discharge. Verbalized understanding of discharge instructions, follow-up care and prescriptions. VSS. Skin warm and dry. A&O x4.  

## 2017-04-02 NOTE — ED Triage Notes (Signed)
Patient with complaint of cough that started yesterday. Patient states that today she feels achy and had a fever of 101 at home.

## 2017-09-03 ENCOUNTER — Encounter: Payer: Self-pay | Admitting: Emergency Medicine

## 2017-09-03 ENCOUNTER — Emergency Department
Admission: EM | Admit: 2017-09-03 | Discharge: 2017-09-03 | Disposition: A | Discharge status: Home / Self Care | Payer: BLUE CROSS/BLUE SHIELD | Attending: Emergency Medicine | Admitting: Emergency Medicine

## 2017-09-03 ENCOUNTER — Other Ambulatory Visit: Payer: Self-pay

## 2017-09-03 ENCOUNTER — Emergency Department: Payer: BLUE CROSS/BLUE SHIELD

## 2017-09-03 DIAGNOSIS — E1165 Type 2 diabetes mellitus with hyperglycemia: Secondary | ICD-10-CM | POA: Diagnosis not present

## 2017-09-03 DIAGNOSIS — Z7984 Long term (current) use of oral hypoglycemic drugs: Secondary | ICD-10-CM | POA: Insufficient documentation

## 2017-09-03 DIAGNOSIS — R739 Hyperglycemia, unspecified: Secondary | ICD-10-CM

## 2017-09-03 DIAGNOSIS — R079 Chest pain, unspecified: Secondary | ICD-10-CM | POA: Insufficient documentation

## 2017-09-03 DIAGNOSIS — R0789 Other chest pain: Secondary | ICD-10-CM | POA: Diagnosis not present

## 2017-09-03 LAB — BASIC METABOLIC PANEL
Anion gap: 11 (ref 5–15)
BUN: 15 mg/dL (ref 6–20)
CALCIUM: 9.1 mg/dL (ref 8.9–10.3)
CO2: 25 mmol/L (ref 22–32)
CREATININE: 0.5 mg/dL (ref 0.44–1.00)
Chloride: 98 mmol/L (ref 98–111)
GFR calc Af Amer: >60 mL/min (ref >60)
GLUCOSE: 351 mg/dL — AB (ref 70–99)
Potassium: 3.9 mmol/L (ref 3.5–5.1)
Sodium: 134 mmol/L — ABNORMAL LOW (ref 135–145)

## 2017-09-03 LAB — CBC
HCT: 38.5 % (ref 35.0–47.0)
HEMOGLOBIN: 13.6 g/dL (ref 12.0–16.0)
MCH: 30.9 pg (ref 26.0–34.0)
MCHC: 35.2 g/dL (ref 32.0–36.0)
MCV: 87.9 fL (ref 80.0–100.0)
PLATELETS: 201 10*3/uL (ref 150–440)
RBC: 4.38 MIL/uL (ref 3.80–5.20)
RDW: 12.3 % (ref 11.5–14.5)
WBC: 6.4 10*3/uL (ref 3.6–11.0)

## 2017-09-03 LAB — TROPONIN I

## 2017-09-03 MED ORDER — METFORMIN HCL 500 MG PO TABS: 500.0000 mg | ORAL_TABLET | Freq: Two times a day (BID) | ORAL | 1 refills | Status: DC

## 2017-09-03 NOTE — ED Notes (Signed)
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

## 2017-09-03 NOTE — ED Triage Notes (Signed)
Pt states left sided cp that began about 2 weeks ago occurs intermittently, states her left arm "goes cold" for a few seconds with left arm pain but resolves on it's own, appears in NAD at this time.

## 2017-09-03 NOTE — ED Notes (Signed)
First Nurse Note: Patient complaining of chest and left arm pain X 1.5 weeks.  Here to today because "it won't go away".  Alert and oriented, skin warm and dry.  NAD.

## 2017-09-03 NOTE — ED Provider Notes (Signed)
[facility] Emergency Department Provider Note  ____________________________________________  Time seen: Approximately 12:31 PM  I have reviewed the triage vital signs and the nursing notes.   HISTORY  Chief Complaint Chest Pain   HPI Laura Mitchell is a 42 y.o. female with a history of diabetes and medication noncompliance who presents for evaluation of chest pain.  Patient reports that she has been off of her insulin and metformin for over a year since losing her insurance and moving to [place].  She currently has insurance but has not reestablish care.  For the last 2 weeks she has had what she describes as discomfort located on the left side of her chest.  She denies having any pain in her chest.  She reports that taking a deep breath it relieves that discomfort but she denies shortness of breath or cough.  She has been having intermittent episodes where she feels like her left hand goes cold.  She denies any cyanosis or pale fingers, she denies any weakness or numbness of the left hand or arm.  She denies any back pain or history of high blood pressure.  She reports family history of heart problems, no personal history of heart disease.  She is not a smoker.  Past Medical History:  Diagnosis Date  . Diabetes mellitus without complication [facility])      Past Surgical History:  Procedure Laterality Date  . CESAREAN SECTION    . CHOLECYSTECTOMY      Prior to Admission medications   Medication Sig Start Date End Date Taking? Authorizing Provider  metFORMIN (GLUCOPHAGE) 500 MG tablet Take 1 tablet (500 mg total) by mouth 2 (two) times daily with a meal. 09/03/17 09/03/18  Nita Sickle, MD    Allergies Patient has no known allergies.  No family history on file.  Social History Social History   Tobacco Use  . Smoking status: Never Smoker  . Smokeless tobacco: Never Used  Substance Use Topics  . Alcohol use: Not Currently  . Drug use:  Not Currently    Review of Systems  Constitutional: Negative for fever. Eyes: Negative for visual changes. ENT: Negative for sore throat. Neck: No neck pain  Cardiovascular: Negative for chest pain. + chest discomfort Respiratory: Negative for shortness of breath. Gastrointestinal: Negative for abdominal pain, vomiting or diarrhea. Genitourinary: Negative for dysuria. Musculoskeletal: Negative for back pain. + L hand coldness Skin: Negative for rash. Neurological: Negative for headaches, weakness or numbness. Psych: No SI or HI  ____________________________________________   PHYSICAL EXAM:  VITAL SIGNS: ED Triage Vitals  Enc Vitals Group     BP 09/03/17 1033 119/76     Pulse Rate 09/03/17 1033 81     Resp 09/03/17 1033 16     Temp 09/03/17 1033 98.6 F (37 C)     Temp Source 09/03/17 1033 Oral     SpO2 09/03/17 1033 99 %     Weight 09/03/17 1034 177 lb (80.3 kg)     Height 09/03/17 1034 5\' 3"  (1.6 m)     Head Circumference --      Peak Flow --      Pain Score 09/03/17 1033 6     Pain Loc --      Pain Edu? --      Excl. in GC? --     Constitutional: Alert and oriented. Well appearing and in no apparent distress. HEENT:      Head: Normocephalic and atraumatic.  Eyes: Conjunctivae are normal. Sclera is non-icteric.       Mouth/Throat: Mucous membranes are moist.       Neck: Supple with no signs of meningismus. Cardiovascular: Regular rate and rhythm. No murmurs, gallops, or rubs. 2+ symmetrical distal pulses are present in all extremities. No JVD. Respiratory: Normal respiratory effort. Lungs are clear to auscultation bilaterally. No wheezes, crackles, or rhonchi.  Gastrointestinal: Soft, non tender, and non distended with positive bowel sounds. No rebound or guarding. Musculoskeletal: Nontender with normal range of motion in all extremities. No edema, cyanosis, or erythema of extremities. Neurologic: Normal speech and language. Face is symmetric. Moving all  extremities. No gross focal neurologic deficits are appreciated. Skin: Skin is warm, dry and intact. No rash noted. Psychiatric: Mood and affect are normal. Speech and behavior are normal.  ____________________________________________   LABS (all labs ordered are listed, but only abnormal results are displayed)  Labs Reviewed  BASIC METABOLIC PANEL - Abnormal; Notable for the following components:      Result Value   Sodium 134 (*)    Glucose, Bld 351 (*)    All other components within normal limits  CBC  TROPONIN I   ____________________________________________  EKG  ED ECG REPORT I, Nita Sickle, the attending physician, personally viewed and interpreted this ECG.  Normal sinus rhythm, rate of 92, normal intervals, normal axis, no ST elevations or depressions.  Normal EKG.  Unchanged from prior from 2010. ____________________________________________  RADIOLOGY  I have personally reviewed the images performed during this visit and I agree with the Radiologist's read.   Interpretation by Radiologist:  Dg Chest 2 View  Result Date: 09/03/2017 CLINICAL DATA:  Chest pain, 2 weeks duration. EXAM: CHEST - 2 VIEW COMPARISON:  04/02/2017 FINDINGS: Heart size is normal. Mediastinal shadows are normal. The lungs are clear. The vascularity is normal. No effusions. No significant finding. IMPRESSION: Normal chest Electronically Signed   By: Paulina Fusi M.D.   On: 09/03/2017 11:33     ____________________________________________   PROCEDURES  Procedure(s) performed: None Procedures Critical Care performed:  None ____________________________________________   INITIAL IMPRESSION / ASSESSMENT AND PLAN / ED COURSE  42 y.o. female with a history of diabetes and medication noncompliance who presents for evaluation of chest discomfort and L hand coldness.  During my evaluation of patient, she reports that her hand was feeling cold at that time.  Touching both of her hands I  noticed no difference in temperature, she has no cyanosis, no pale discoloration of the hand or fingers, she has strong intact radial pulses bilaterally, intact strength and sensation. EKG showing no evidence of dysrhythmias or ischemia.  At this time no clinical signs or symptoms of dissection with intact neurological exam, normotensive, no significant chest pain or pain radiating to her back, normal mediastinum silhouette on chest x-ray.  Patient is PERC negative.  Heart score of 1 with normal EKG and negative troponin. Symptoms have been constant for 2 weeks therefore no need to repeat a 2nd troponin. Her symptoms could be possibly due to neuropathy in the setting of medication noncompliance for her diabetes for over a year.  Will restart patient on metformin and now that she has insurance we will refer her to the [facility] for further care.  Discussed return precautions for any chest pain, extremity weakness or cyanosis, dizziness, shortness of breath.      As part of my medical decision making, I reviewed the following data within the electronic MEDICAL RECORD NUMBER Nursing  notes reviewed and incorporated, Labs reviewed , EKG interpreted , Old EKG reviewed, Old chart reviewed, Radiograph reviewed , Notes from prior ED visits and [place] Controlled Substance Database    Pertinent labs & imaging results that were available during my care of the patient were reviewed by me and considered in my medical decision making (see chart for details).    ____________________________________________   FINAL CLINICAL IMPRESSION(S) / ED DIAGNOSES  Final diagnoses:  Chest pain, unspecified type  Hyperglycemia      NEW MEDICATIONS STARTED DURING THIS VISIT:  ED Discharge Orders         Ordered    metFORMIN (GLUCOPHAGE) 500 MG tablet  2 times daily with meals     09/03/17 1241           Note:  This document was prepared using Dragon voice recognition software and may include unintentional  dictation errors.    Don PerkingVeronese, [place], MD 09/03/17 1249

## 2017-09-03 NOTE — Discharge Instructions (Addendum)

## 2018-01-20 ENCOUNTER — Other Ambulatory Visit: Payer: Self-pay

## 2018-01-20 ENCOUNTER — Encounter (HOSPITAL_COMMUNITY): Payer: Self-pay

## 2018-01-20 ENCOUNTER — Ambulatory Visit (HOSPITAL_COMMUNITY)
Admission: EM | Admit: 2018-01-20 | Discharge: 2018-01-20 | Disposition: A | Discharge status: Home / Self Care | Payer: BLUE CROSS/BLUE SHIELD | Attending: Family Medicine | Admitting: Family Medicine

## 2018-01-20 DIAGNOSIS — R350 Frequency of micturition: Secondary | ICD-10-CM | POA: Insufficient documentation

## 2018-01-20 LAB — POCT URINALYSIS DIP (DEVICE)
BILIRUBIN URINE: NEGATIVE
HGB URINE DIPSTICK: NEGATIVE
Ketones, ur: 15 mg/dL — AB
LEUKOCYTES UA: NEGATIVE
NITRITE: NEGATIVE
Protein, ur: NEGATIVE mg/dL
Specific Gravity, Urine: 1.02 (ref 1.005–1.030)
Urobilinogen, UA: 0.2 mg/dL (ref 0.0–1.0)
pH: 5 (ref 5.0–8.0)

## 2018-01-20 LAB — GLUCOSE, CAPILLARY: GLUCOSE-CAPILLARY: 320 mg/dL — AB (ref 70–99)

## 2018-01-20 NOTE — ED Triage Notes (Signed)
Pt presents to [facility] for possible UTI x2 weeks, pt complains of odor, urinary frequency, and Dark yellow urine.

## 2018-01-20 NOTE — Discharge Instructions (Addendum)
I believe your symptoms may be related to your elevated blood glucose levels Your blood sugar was over 300 today.  We are sending your urine for culture and cytology We will call you with any positive results and treat as needed You may need to call and try to get in sooner with your primary care to get your blood sugar under control Make sure you are drinking plenty of water and staying hydrated Avoid foods that are high in sugar and carbs.

## 2018-01-21 LAB — URINE CYTOLOGY ANCILLARY ONLY
Chlamydia: NEGATIVE
Neisseria Gonorrhea: NEGATIVE
TRICH (WINDOWPATH): NEGATIVE

## 2018-01-22 LAB — URINE CULTURE

## 2018-01-24 LAB — URINE CYTOLOGY ANCILLARY ONLY
Bacterial vaginitis: NEGATIVE
Candida vaginitis: NEGATIVE

## 2018-01-24 NOTE — ED Provider Notes (Signed)
[facility]    CSN: [id number] Arrival date & time: 01/20/18  1844     History   Chief Complaint Chief Complaint  Patient presents with  . Urinary Tract Infection    HPI Laura Mitchell is a 43 y.o. female.   Pt is a 43 year old female with hx of diabetes. She presents with approximately 2 weeks of urinary frequency, odor, discolored urine.  This is been constant and remain the same.  She has not done anything to treat her symptoms.  She denies any abdominal pain, back pain, pelvic pain.  She denies any fever, chills, nausea. Pt does not check her blood sugars regularly. She denies any vaginal symptoms.   ROS per HPI      Past Medical History:  Diagnosis Date  . Diabetes mellitus without complication (HCC)     There are no active problems to display for this patient.   Past Surgical History:  Procedure Laterality Date  . CESAREAN SECTION    . CHOLECYSTECTOMY      OB History   No obstetric history on file.      Home Medications    Prior to Admission medications   Medication Sig Start Date End Date Taking? Authorizing Provider  metFORMIN (GLUCOPHAGE) 500 MG tablet Take 1 tablet (500 mg total) by mouth 2 (two) times daily with a meal. 09/03/17 09/03/18  Nita Sickle, MD    Family History History reviewed. No pertinent family history.  Social History Social History   Tobacco Use  . Smoking status: Never Smoker  . Smokeless tobacco: Never Used  Substance Use Topics  . Alcohol use: Not Currently  . Drug use: Not Currently     Allergies   Patient has no known allergies.   Review of Systems Review of Systems   Physical Exam Triage Vital Signs ED Triage Vitals  Enc Vitals Group     BP 01/20/18 2006 112/72     Pulse Rate 01/20/18 2006 87     Resp 01/20/18 2006 17     Temp 01/20/18 2006 98 F (36.7 C)     Temp Source 01/20/18 2006 Oral     SpO2 01/20/18 2006 100 %     Weight --      Height --      Head Circumference --      Peak Flow --      Pain Score 01/20/18 2007 0     Pain Loc --      Pain Edu? --      Excl. in GC? --    No data found.  Updated Vital Signs BP 112/72 (BP Location: Left Arm)   Pulse 87   Temp 98 F (36.7 C) (Oral)   Resp 17   LMP 01/01/2018 (Exact Date)   SpO2 100%   Visual Acuity Right Eye Distance:   Left Eye Distance:   Bilateral Distance:    Right Eye Near:   Left Eye Near:    Bilateral Near:     Physical Exam Vitals signs and nursing note reviewed.  Constitutional:      General: She is not in acute distress.    Appearance: She is well-developed.  HENT:     Head: Normocephalic and atraumatic.  Eyes:     Conjunctiva/sclera: Conjunctivae normal.  Neck:     Musculoskeletal: Neck supple.  Cardiovascular:     Rate and Rhythm: Normal rate and regular rhythm.     Heart sounds: No murmur.  Pulmonary:  Effort: Pulmonary effort is normal. No respiratory distress.     Breath sounds: Normal breath sounds.  Abdominal:     Palpations: Abdomen is soft.     Tenderness: There is no abdominal tenderness.  Skin:    General: Skin is warm and dry.  Neurological:     Mental Status: She is alert.      UC Treatments / Results  Labs (all labs ordered are listed, but only abnormal results are displayed) Labs Reviewed  URINE CULTURE - Abnormal; Notable for the following components:      Result Value   Culture MULTIPLE SPECIES PRESENT, SUGGEST RECOLLECTION (*)    All other components within normal limits  GLUCOSE, CAPILLARY - Abnormal; Notable for the following components:   Glucose-Capillary 320 (*)    All other components within normal limits  POCT URINALYSIS DIP (DEVICE) - Abnormal; Notable for the following components:   Glucose, UA >=1000 (*)    Ketones, ur 15 (*)    All other components within normal limits  CBG MONITORING, ED  URINE CYTOLOGY ANCILLARY ONLY    EKG None  Radiology No results found.  Procedures Procedures (including critical care  time)  Medications Ordered in UC Medications - No data to display  Initial Impression / Assessment and Plan / UC Course  I have reviewed the triage vital signs and the nursing notes.  Pertinent labs & imaging results that were available during my care of the patient were reviewed by me and considered in my medical decision making (see chart for details).     Pt is a known diabetic with 2 weeks of UTI symptoms. Her urine was negative for infection but had >1000 glucose She is not currently taking any diabetes medication She has scheduled PCP appointment in February Her CBG today was over 300 She was taking metformin but reports that it made her sick.  She was also seeing an endocrinologist previously and on insulin.  We will have her keep her appointment as scheduled The diabetes and elevated blood sugar could be the cause of her symptoms We will send the urine for culture and cytology Lab results pending we will call with any positive results Final Clinical Impressions(s) / UC Diagnoses   Final diagnoses:  Urinary frequency     Discharge Instructions     I believe your symptoms may be related to your elevated blood glucose levels Your blood sugar was over 300 today.  We are sending your urine for culture and cytology We will call you with any positive results and treat as needed You may need to call and try to get in sooner with your primary care to get your blood sugar under control Make sure you are drinking plenty of water and staying hydrated Avoid foods that are high in sugar and carbs.    ED Prescriptions    None     Controlled Substance Prescriptions [place] Controlled Substance Registry consulted? no   Janace Aris, NP 01/24/18 [phone]

## 2018-02-08 ENCOUNTER — Ambulatory Visit: Payer: Self-pay | Admitting: Family Medicine

## 2018-02-23 ENCOUNTER — Ambulatory Visit (INDEPENDENT_AMBULATORY_CARE_PROVIDER_SITE_OTHER): Payer: BLUE CROSS/BLUE SHIELD | Admitting: Family Medicine

## 2018-02-23 ENCOUNTER — Other Ambulatory Visit: Payer: Self-pay

## 2018-02-23 ENCOUNTER — Encounter: Payer: Self-pay | Admitting: Family Medicine

## 2018-02-23 ENCOUNTER — Encounter

## 2018-02-23 VITALS — BP 118/76 | HR 81 | Temp 97.8°F | Resp 16 | Ht 64.5 in | Wt 186.4 lb

## 2018-02-23 DIAGNOSIS — E119 Type 2 diabetes mellitus without complications: Secondary | ICD-10-CM

## 2018-02-23 DIAGNOSIS — R1010 Upper abdominal pain, unspecified: Secondary | ICD-10-CM | POA: Diagnosis not present

## 2018-02-23 DIAGNOSIS — R197 Diarrhea, unspecified: Secondary | ICD-10-CM

## 2018-02-23 DIAGNOSIS — Z1159 Encounter for screening for other viral diseases: Secondary | ICD-10-CM

## 2018-02-23 DIAGNOSIS — Z6831 Body mass index (BMI) 31.0-31.9, adult: Secondary | ICD-10-CM

## 2018-02-23 DIAGNOSIS — Z13220 Encounter for screening for lipoid disorders: Secondary | ICD-10-CM | POA: Diagnosis not present

## 2018-02-23 DIAGNOSIS — E6609 Other obesity due to excess calories: Secondary | ICD-10-CM | POA: Diagnosis not present

## 2018-02-23 DIAGNOSIS — Z114 Encounter for screening for human immunodeficiency virus [HIV]: Secondary | ICD-10-CM

## 2018-02-23 LAB — POCT GLYCOSYLATED HEMOGLOBIN (HGB A1C): HbA1c, POC (controlled diabetic range): 12.8 % — AB (ref 0.0–7.0)

## 2018-02-23 MED ORDER — INSULIN PEN NEEDLE 32G X 6 MM MISC: 1.0000 | 0 refills | Status: DC

## 2018-02-23 MED ORDER — FREESTYLE LIBRE 14 DAY SENSOR MISC: 1.0000 | 2 refills | Status: DC

## 2018-02-23 MED ORDER — DICYCLOMINE HCL 10 MG PO CAPS: 10.0000 mg | ORAL_CAPSULE | Freq: Three times a day (TID) | ORAL | 0 refills | Status: DC | PRN

## 2018-02-23 MED ORDER — INSULIN DEGLUDEC-LIRAGLUTIDE 100-3.6 UNIT-MG/ML ~~LOC~~ SOPN: 12.0000 [IU] | PEN_INJECTOR | Freq: Every day | SUBCUTANEOUS | 1 refills | Status: DC

## 2018-02-23 MED ORDER — FREESTYLE LIBRE SENSOR SYSTEM MISC: 0 refills | Status: DC

## 2018-02-23 NOTE — Patient Instructions (Addendum)
[place]: Goal is Fasting Blood Sugar between 100-150 Start at 12 units, increase by 2 units every 3 days until you are either at 20 units or blood sugar is between 100-150, then hold at that dose and call our office.  Low-FODMAP Eating Plan  FODMAPs (fermentable oligosaccharides, disaccharides, monosaccharides, and polyols) are sugars that are hard for some people to digest. A low-FODMAP eating plan may help some people who have bowel (intestinal) diseases to manage their symptoms. This meal plan can be complicated to follow. Work with a diet and nutrition specialist (dietitian) to make a low-FODMAP eating plan that is right for you. A dietitian can make sure that you get enough nutrition from this diet. What are tips for following this plan? Reading food labels  Check labels for hidden FODMAPs such as: ? High-fructose syrup. ? Honey. ? Agave. ? Natural fruit flavors. ? Onion or garlic powder.  Choose low-FODMAP foods that contain 3-4 grams of fiber per serving.  Check food labels for serving sizes. Eat only one serving at a time to make sure FODMAP levels stay low. Meal planning  Follow a low-FODMAP eating plan for up to 6 weeks, or as told by your health care provider or dietitian.  To follow the eating plan: 1. Eliminate high-FODMAP foods from your diet completely. 2. Gradually reintroduce high-FODMAP foods into your diet one at a time. Most people should wait a few days after introducing one high-FODMAP food before they introduce the next high-FODMAP food. Your dietitian can recommend how quickly you may reintroduce foods. 3. Keep a daily record of what you eat and drink, and make note of any symptoms that you have after eating. 4. Review your daily record with a dietitian regularly. Your dietitian can help you identify which foods you can eat and which foods you should avoid. General tips  Drink enough fluid each day to keep your urine pale yellow.  Avoid processed foods.  These often have added sugar and may be high in FODMAPs.  Avoid most dairy products, whole grains, and sweeteners.  Work with a dietitian to make sure you get enough fiber in your diet. Recommended foods Grains  Gluten-free grains, such as rice, oats, buckwheat, quinoa, corn, polenta, and millet. Gluten-free pasta, bread, or cereal. Rice noodles. Corn tortillas. Vegetables  Eggplant, zucchini, cucumber, peppers, green beans, Brussels sprouts, bean sprouts, lettuce, arugula, kale, Swiss chard, spinach, collard greens, bok choy, summer squash, potato, and tomato. Limited amounts of corn, carrot, and sweet potato. Green parts of scallions. Fruits  Bananas, oranges, lemons, limes, blueberries, raspberries, strawberries, grapes, cantaloupe, honeydew melon, kiwi, papaya, passion fruit, and pineapple. Limited amounts of dried cranberries, banana chips, and shredded coconut. Dairy  Lactose-free milk, yogurt, and kefir. Lactose-free cottage cheese and ice cream. Non-dairy milks, such as almond, coconut, hemp, and rice milk. Yogurts made of non-dairy milks. Limited amounts of goat cheese, brie, mozzarella, parmesan, swiss, and other hard cheeses. Meats and other protein foods  Unseasoned beef, pork, poultry, or fish. Eggs. Tomasa BlaseBacon. Tofu (firm) and tempeh. Limited amounts of nuts and seeds, such as almonds, walnuts, Eston[place] nuts, pecans, peanuts, pumpkin seeds, chia seeds, and sunflower seeds. Fats and oils  Butter-free spreads. Vegetable oils, such as olive, canola, and sunflower oil. Seasoning and other foods  Artificial sweeteners with names that do not end in "ol" such as aspartame, saccharine, and stevia. Maple syrup, white table sugar, raw sugar, brown sugar, and molasses. Fresh basil, coriander, parsley, rosemary, and thyme. Beverages  Water and mineral water.  Sugar-sweetened soft drinks. Small amounts of orange juice or cranberry juice. Black and green tea. Most dry wines. Coffee. This may  not be a complete list of low-FODMAP foods. Talk with your dietitian for more information. Foods to avoid Grains  Wheat, including kamut, durum, and semolina. Barley and bulgur. Couscous. Wheat-based cereals. Wheat noodles, bread, crackers, and pastries. Vegetables  Chicory root, artichoke, asparagus, cabbage, snow peas, sugar snap peas, mushrooms, and cauliflower. Onions, garlic, leeks, and the white part of scallions. Fruits  Fresh, dried, and juiced forms of apple, pear, watermelon, peach, plum, cherries, apricots, blackberries, boysenberries, figs, nectarines, and mango. Avocado. Dairy  Milk, yogurt, ice cream, and soft cheese. Cream and sour cream. Milk-based sauces. Custard. Meats and other protein foods  Fried or fatty meat. Sausage. Cashews and pistachios. Soybeans, baked beans, black beans, chickpeas, kidney beans, fava beans, navy beans, lentils, and split peas. Seasoning and other foods  Any sugar-free gum or candy. Foods that contain artificial sweeteners such as sorbitol, mannitol, isomalt, or xylitol. Foods that contain honey, high-fructose corn syrup, or agave. Bouillon, vegetable stock, beef stock, and chicken stock. Garlic and onion powder. Condiments made with onion, such as hummus, chutney, pickles, relish, salad dressing, and salsa. Tomato paste. Beverages  Chicory-based drinks. Coffee substitutes. Chamomile tea. Fennel tea. Sweet or fortified wines such as port or sherry. Diet soft drinks made with isomalt, mannitol, maltitol, sorbitol, or xylitol. Apple, pear, and mango juice. Juices with high-fructose corn syrup. This may not be a complete list of high-FODMAP foods. Talk with your dietitian to discuss what dietary choices are best for you.  Summary  A low-FODMAP eating plan is a short-term diet that eliminates FODMAPs from your diet to help ease symptoms of certain bowel diseases.  The eating plan usually lasts up to 6 weeks. After that, high-FODMAP foods are  restarted gradually, one at a time, so you can find out which may be causing symptoms.  A low-FODMAP eating plan can be complicated. It is best to work with a dietitian who has experience with this type of plan. This information is not intended to replace advice given to you by your health care provider. Make sure you discuss any questions you have with your health care provider. Document Released: 08/18/2016 Document Revised: 08/18/2016 Document Reviewed: 08/18/2016 Elsevier Interactive Patient Education  2019 [employer]   Diabetes Mellitus and Nutrition, Adult When you have diabetes (diabetes mellitus), it is very important to have healthy eating habits because your blood sugar (glucose) levels are greatly affected by what you eat and drink. Eating healthy foods in the appropriate amounts, at about the same times every day, can help you:  Control your blood glucose.  Lower your risk of heart disease.  Improve your blood pressure.  Reach or maintain a healthy weight. Every person with diabetes is different, and each person has different needs for a meal plan. Your health care provider may recommend that you work with a diet and nutrition specialist (dietitian) to make a meal plan that is best for you. Your meal plan may vary depending on factors such as:  The calories you need.  The medicines you take.  Your weight.  Your blood glucose, blood pressure, and cholesterol levels.  Your activity level.  Other health conditions you have, such as heart or kidney disease. How do carbohydrates affect me? Carbohydrates, also called carbs, affect your blood glucose level more than any other type of food. Eating carbs naturally raises the amount of glucose in your  blood. Carb counting is a method for keeping track of how many carbs you eat. Counting carbs is important to keep your blood glucose at a healthy level, especially if you use insulin or take certain oral diabetes medicines. It is  important to know how many carbs you can safely have in each meal. This is different for every person. Your dietitian can help you calculate how many carbs you should have at each meal and for each snack. Foods that contain carbs include:  Bread, cereal, rice, pasta, and crackers.  Potatoes and corn.  Peas, beans, and lentils.  Milk and yogurt.  Fruit and juice.  Desserts, such as cakes, cookies, ice cream, and candy. How does alcohol affect me? Alcohol can cause a sudden decrease in blood glucose (hypoglycemia), especially if you use insulin or take certain oral diabetes medicines. Hypoglycemia can be a life-threatening condition. Symptoms of hypoglycemia (sleepiness, dizziness, and confusion) are similar to symptoms of having too much alcohol. If your health care provider says that alcohol is safe for you, follow these guidelines:  Limit alcohol intake to no more than 1 drink per day for nonpregnant women and 2 drinks per day for men. One drink equals 12 oz of beer, 5 oz of wine, or 1 oz of hard liquor.  Do not drink on an empty stomach.  Keep yourself hydrated with water, diet soda, or unsweetened iced tea.  Keep in mind that regular soda, juice, and other mixers may contain a lot of sugar and must be counted as carbs. What are tips for following this plan?  Reading food labels  Start by checking the serving size on the "Nutrition Facts" label of packaged foods and drinks. The amount of calories, carbs, fats, and other nutrients listed on the label is based on one serving of the item. Many items contain more than one serving per package.  Check the total grams (g) of carbs in one serving. You can calculate the number of servings of carbs in one serving by dividing the total carbs by 15. For example, if a food has 30 g of total carbs, it would be equal to 2 servings of carbs.  Check the number of grams (g) of saturated and trans fats in one serving. Choose foods that have low or  no amount of these fats.  Check the number of milligrams (mg) of salt (sodium) in one serving. Most people should limit total sodium intake to less than 2,300 mg per day.  Always check the nutrition information of foods labeled as "low-fat" or "nonfat". These foods may be higher in added sugar or refined carbs and should be avoided.  Talk to your dietitian to identify your daily goals for nutrients listed on the label. Shopping  Avoid buying canned, premade, or processed foods. These foods tend to be high in fat, sodium, and added sugar.  Shop around the outside edge of the grocery store. This includes fresh fruits and vegetables, bulk grains, fresh meats, and fresh dairy. Cooking  Use low-heat cooking methods, such as baking, instead of high-heat cooking methods like deep frying.  Cook using healthy oils, such as olive, canola, or sunflower oil.  Avoid cooking with butter, cream, or high-fat meats. Meal planning  Eat meals and snacks regularly, preferably at the same times every day. Avoid going long periods of time without eating.  Eat foods high in fiber, such as fresh fruits, vegetables, beans, and whole grains. Talk to your dietitian about how many servings of carbs  you can eat at each meal.  Eat 4-6 ounces (oz) of lean protein each day, such as lean meat, chicken, fish, eggs, or tofu. One oz of lean protein is equal to: ? 1 oz of meat, chicken, or fish. ? 1 egg. ?  cup of tofu.  Eat some foods each day that contain healthy fats, such as avocado, nuts, seeds, and fish. Lifestyle  Check your blood glucose regularly.  Exercise regularly as told by your health care provider. This may include: ? 150 minutes of moderate-intensity or vigorous-intensity exercise each week. This could be brisk walking, biking, or water aerobics. ? Stretching and doing strength exercises, such as yoga or weightlifting, at least 2 times a week.  Take medicines as told by your health care  provider.  Do not use any products that contain nicotine or tobacco, such as cigarettes and e-cigarettes. If you need help quitting, ask your health care provider.  Work with a Veterinary surgeon or diabetes educator to identify strategies to manage stress and any emotional and social challenges. Questions to ask a health care provider  Do I need to meet with a diabetes educator?  Do I need to meet with a dietitian?  What number can I call if I have questions?  When are the best times to check my blood glucose? Where to find more information:  American Diabetes Association: diabetes.org  Academy of Nutrition and Dietetics: www.eatright.[employer] of Diabetes and Digestive and Kidney Diseases (NIH): CarFlippers.tn Summary  A healthy meal plan will help you control your blood glucose and maintain a healthy lifestyle.  Working with a diet and nutrition specialist (dietitian) can help you make a meal plan that is best for you.  Keep in mind that carbohydrates (carbs) and alcohol have immediate effects on your blood glucose levels. It is important to count carbs and to use alcohol carefully. This information is not intended to replace advice given to you by your health care provider. Make sure you discuss any questions you have with your health care provider. Document Released: 09/18/2004 Document Revised: 07/22/2016 Document Reviewed: 01/27/2016 Elsevier Interactive Patient Education  2019 [employer].

## 2018-02-23 NOTE — Progress Notes (Addendum)
New Patient Office Visit  Subjective:  Patient ID: Laura Mitchell, female    DOB: 01/26/1975  Age: 43 y.o. MRN: [id number]  CC:  Chief Complaint  Patient presents with  . Establish Care  . Diabetes    HPI Laura Mitchell presents for establish care and the following:  Social: She moved from [place] in 2018, has not had care from PCP since 2018.  She moved here with 3 adolescent children and her husband. Her mother lived here in [place], but she passed away in April 02, 2017.  She is coping with this death well at this time.  Diabetes mellitus type 2 Checking sugars?  no Does patient feel additional teaching/training would be helpful?  no  Have they attended Diabetes education classes? no  Trying to limit white bread, white rice, white potatoes, sweets?  She tries to limit carbs Trying to limit sweetened drinks like iced tea, soft drinks, sports drinks, fruit juices?  no - uses artificial sweetener if needed in her unsweetened tea, does drink a lot of water.  Checking feet every day/night?  yes Last eye exam:  Had eye exam in December/November  Denies: Polyuria, polydipsia, polyphagia, no vision changes..  Does get sharp pains in her heels every now and then - no numbness or tingling.  Most recent A1C:  Lab Results  Component Value Date   HGBA1C 12.8 (A) 02/23/2018    Last CMP Results : is due for repeat today    Component Value Date/Time   NA 134 (L) 09/03/2017 1100   K 3.9 09/03/2017 1100   CL 98 09/03/2017 1100   CO2 25 09/03/2017 1100   GLUCOSE 351 (H) 09/03/2017 1100   BUN 15 09/03/2017 1100   CREATININE 0.50 09/03/2017 1100   CALCIUM 9.1 09/03/2017 1100   GFRNONAA >60 09/03/2017 1100   GFRAA >60 09/03/2017 1100  Urine Micro UTD? Yes Current Medication Management: Diabetic Medications:  ACEI/ARB: No, used to be on lisinopril, but has not been taking for years. Statin: No, we will check labs today Aspirin therapy: No No family or personal history of thyroid cancer, no  personal history of pancreatitis.  Abdominal Pain: Started last year upper abdomen, causes her to not be able to eat, sometimes unable to eat a meal before she has to have a BM.  Always has diarrhea. She did have colonoscopy/endoscopy when living in [place] about 3 years ago and this was normal. Has had gallbladder removed.  Does have occasional nausea, no vomiting, no blood in stool, no acid reflux hx.  She does not want to see GI at this time, wants to trial Low FodMap diet and have labs performed. She does have uncontrolled DM.  Past Medical History:  Diagnosis Date  . Diabetes mellitus without complication [facility])     Past Surgical History:  Procedure Laterality Date  . CESAREAN SECTION    . CHOLECYSTECTOMY      Family History  Problem Relation Age of Onset  . Diabetes Mother   . Kidney disease Mother   . Heart disease Mother   . Diabetes Father   . Arthritis Father   . Heart disease Maternal Grandmother   . Heart disease Maternal Grandfather   . Alzheimer's disease Paternal Grandmother     Social History   Socioeconomic History  . Marital status: Married    Spouse name: Laura Mitchell  . Number of children: 3  . Years of education: Not on file  . Highest education level: Not on file  Occupational History  . Not on file  Social Needs  . Financial resource strain: Not on file  . Food insecurity:    Worry: Never true    Inability: Never true  . Transportation needs:    Medical: No    Non-medical: No  Tobacco Use  . Smoking status: Never Smoker  . Smokeless tobacco: Never Used  Substance and Sexual Activity  . Alcohol use: Yes    Comment: occasional  . Drug use: Never  . Sexual activity: Yes    Partners: Male    Birth control/protection: Surgical  Lifestyle  . Physical activity:    Days per week: 0 days    Minutes per session: 0 min  . Stress: Not at all  Relationships  . Social connections:    Talks on phone: More than three times a week    Gets together: More  than three times a week    Attends religious service: Never    Active member of club or organization: No    Attends meetings of clubs or organizations: Never    Relationship status: Married  . Intimate partner violence:    Fear of current or ex partner: No    Emotionally abused: No    Physically abused: No    Forced sexual activity: No  Other Topics Concern  . Not on file  Social History Narrative   Lives at home with her husband and three children Laura Mitchell- daughter, 19yo duaghter, 16yo son).    ROS Review of Systems  Constitutional: Negative for fever or weight change.  Respiratory: Negative for cough and shortness of breath.   Cardiovascular: Negative for chest pain or palpitations.  Gastrointestinal: Positive for abdominal pain and bowel changes.  Musculoskeletal: Negative for gait problem or joint swelling.  Skin: Negative for rash.  Neurological: Negative for dizziness or headache.  No other specific complaints in a complete review of systems (except as listed in HPI above).  Objective:   Today's Vitals: BP 118/76 (BP Location: Right Arm, Patient Position: Sitting, Cuff Size: Large)   Pulse 81   Temp 97.8 F (36.6 C) (Oral)   Resp 16   Ht 5' 4.5" (1.638 m)   Wt 186 lb 6.4 oz (84.6 kg)   SpO2 97%   BMI 31.50 kg/m   Physical Exam Vitals signs and nursing note reviewed.  Constitutional:      General: She is not in acute distress.    Appearance: She is well-developed.  HENT:     Head: Normocephalic and atraumatic.     Right Ear: External ear normal.     Left Ear: External ear normal.     Nose: Nose normal.     Mouth/Throat:     Pharynx: No oropharyngeal exudate.  Eyes:     Conjunctiva/sclera: Conjunctivae normal.     Pupils: Pupils are equal, round, and reactive to light.  Neck:     Musculoskeletal: Normal range of motion and neck supple.     Vascular: No JVD.  Cardiovascular:     Rate and Rhythm: Normal rate and regular rhythm.     Heart sounds: Normal  heart sounds.  Pulmonary:     Effort: Pulmonary effort is normal.     Breath sounds: Normal breath sounds.  Abdominal:     General: Bowel sounds are normal.     Palpations: Abdomen is soft.  Musculoskeletal: Normal range of motion.        General: No tenderness.  Skin:    General:  Skin is warm and dry.     Findings: No rash.  Neurological:     Mental Status: She is alert and oriented to person, place, and time.     Cranial Nerves: No cranial nerve deficit.  Psychiatric:        Mood and Affect: Mood normal.        Behavior: Behavior normal.        Thought Content: Thought content normal.        Judgment: Judgment normal.    Assessment & Plan:   Problem List Items Addressed This Visit      Endocrine   Diabetes mellitus without complication (HCC) - Primary   Relevant Medications   Insulin Pen Needle (NOVOFINE) 32G X 6 MM MISC   Insulin Degludec-Liraglutide (XULTOPHY) 100-3.6 UNIT-MG/ML SOPN   Continuous Blood Gluc Sensor (FREESTYLE LIBRE SENSOR SYSTEM) MISC   Continuous Blood Gluc Sensor (FREESTYLE LIBRE 14 DAY SENSOR) MISC   Other Relevant Orders   POCT glycosylated hemoglobin (Hb A1C) (Completed)   Urine Microalbumin w/creat. ratio   Ambulatory referral to Ophthalmology   Ambulatory referral to Endocrinology   Ambulatory referral to Chronic Care Management Services     Other   Class 1 obesity due to excess calories with serious comorbidity and body mass index (BMI) of 31.0 to 31.9 in adult   Relevant Medications   Insulin Degludec-Liraglutide (XULTOPHY) 100-3.6 UNIT-MG/ML SOPN   Other Relevant Orders   COMPLETE METABOLIC PANEL WITH GFR   Lipid panel   Ambulatory referral to Chronic Care Management Services   Pain of upper abdomen   Relevant Medications   dicyclomine (BENTYL) 10 MG capsule   Other Relevant Orders   H. pylori breath test   Lipase   CBC w/Diff/Platelet   Diarrhea   Relevant Orders   Lipase   CBC w/Diff/Platelet    Other Visit Diagnoses     Lipid screening       Relevant Orders   Lipid panel   Encounter for screening for HIV       Relevant Orders   HIV Antibody (routine testing w rflx)   Need for hepatitis C screening test       Relevant Orders   Hepatitis C antibody      Outpatient Encounter Medications as of 02/23/2018  Medication Sig  . Continuous Blood Gluc Sensor (FREESTYLE LIBRE 14 DAY SENSOR) MISC 1 each by Does not apply route every 14 (fourteen) days.  . Continuous Blood Gluc Sensor (FREESTYLE LIBRE SENSOR SYSTEM) MISC Apply sensor per directions.  . dicyclomine (BENTYL) 10 MG capsule Take 1 capsule (10 mg total) by mouth 3 (three) times daily as needed for spasms.  . Insulin Degludec-Liraglutide (XULTOPHY) 100-3.6 UNIT-MG/ML SOPN Inject 12-20 Units into the skin daily.  . Insulin Pen Needle (NOVOFINE) 32G X 6 MM MISC 1 each by Does not apply route once a week.  . [DISCONTINUED] metFORMIN (GLUCOPHAGE) 500 MG tablet Take 1 tablet (500 mg total) by mouth 2 (two) times daily with a meal. (Patient not taking: Reported on 02/23/2018)   No facility-administered encounter medications on file as of 02/23/2018.    Follow-up: Return in about 2 weeks (around 03/09/2018) for follow up.   Laura CustardEmily E Meagen Limones, FNP

## 2018-02-24 LAB — MICROALBUMIN / CREATININE URINE RATIO
Creatinine, Urine: 58 mg/dL (ref 20–275)
Microalb Creat Ratio: 119 mcg/mg creat — ABNORMAL HIGH (ref <30)
Microalb, Ur: 6.9 mg/dL

## 2018-02-24 LAB — LIPID PANEL
CHOL/HDL RATIO: 3.3 (calc) (ref <5.0)
CHOLESTEROL: 167 mg/dL (ref <200)
HDL: 51 mg/dL (ref >=50)
LDL Cholesterol (Calc): 87 mg/dL (calc)
Non-HDL Cholesterol (Calc): 116 mg/dL (calc) (ref <130)
Triglycerides: 203 mg/dL — ABNORMAL HIGH (ref <150)

## 2018-02-24 LAB — CBC WITH DIFFERENTIAL/PLATELET
ABSOLUTE MONOCYTES: 302 {cells}/uL (ref 200–950)
Basophils Absolute: 38 cells/uL (ref 0–200)
Basophils Relative: 0.6 %
Eosinophils Absolute: 50 cells/uL (ref 15–500)
Eosinophils Relative: 0.8 %
HCT: 42 % (ref 35.0–45.0)
Hemoglobin: 14.4 g/dL (ref 11.7–15.5)
Lymphs Abs: 2432 cells/uL (ref 850–3900)
MCH: 30.5 pg (ref 27.0–33.0)
MCHC: 34.3 g/dL (ref 32.0–36.0)
MCV: 89 fL (ref 80.0–100.0)
MPV: 11.6 fL (ref 7.5–12.5)
Monocytes Relative: 4.8 %
Neutro Abs: 3478 cells/uL (ref 1500–7800)
Neutrophils Relative %: 55.2 %
PLATELETS: 235 10*3/uL (ref 140–400)
RBC: 4.72 10*6/uL (ref 3.80–5.10)
RDW: 11.7 % (ref 11.0–15.0)
Total Lymphocyte: 38.6 %
WBC: 6.3 10*3/uL (ref 3.8–10.8)

## 2018-02-24 LAB — COMPLETE METABOLIC PANEL WITH GFR
AG Ratio: 1.6 (calc) (ref 1.0–2.5)
ALT: 18 U/L (ref 6–29)
AST: 13 U/L (ref 10–30)
Albumin: 4.5 g/dL (ref 3.6–5.1)
Alkaline phosphatase (APISO): 60 U/L (ref 31–125)
BUN: 15 mg/dL (ref 7–25)
CO2: 27 mmol/L (ref 20–32)
CREATININE: 0.74 mg/dL (ref 0.50–1.10)
Calcium: 9.4 mg/dL (ref 8.6–10.2)
Chloride: 99 mmol/L (ref 98–110)
GFR, Est African American: 117 mL/min/{1.73_m2} (ref >=60)
GFR, Est Non African American: 101 mL/min/{1.73_m2} (ref >=60)
Globulin: 2.9 g/dL (calc) (ref 1.9–3.7)
Glucose, Bld: 338 mg/dL — ABNORMAL HIGH (ref 65–99)
Potassium: 4.4 mmol/L (ref 3.5–5.3)
Sodium: 134 mmol/L — ABNORMAL LOW (ref 135–146)
Total Bilirubin: 0.4 mg/dL (ref 0.2–1.2)
Total Protein: 7.4 g/dL (ref 6.1–8.1)

## 2018-02-24 LAB — HEPATITIS C ANTIBODY
Hepatitis C Ab: NONREACTIVE
SIGNAL TO CUT-OFF: 0.01 (ref <1.00)

## 2018-02-24 LAB — LIPASE: Lipase: 27 U/L (ref 7–60)

## 2018-02-24 LAB — HIV ANTIBODY (ROUTINE TESTING W REFLEX): HIV 1&2 Ab, 4th Generation: NONREACTIVE

## 2018-02-24 LAB — H. PYLORI BREATH TEST: H. pylori Breath Test: NOT DETECTED

## 2018-02-25 ENCOUNTER — Ambulatory Visit: Payer: Self-pay

## 2018-02-25 ENCOUNTER — Other Ambulatory Visit: Payer: Self-pay | Admitting: Family Medicine

## 2018-02-25 DIAGNOSIS — E119 Type 2 diabetes mellitus without complications: Secondary | ICD-10-CM

## 2018-02-25 DIAGNOSIS — E1121 Type 2 diabetes mellitus with diabetic nephropathy: Secondary | ICD-10-CM

## 2018-02-25 DIAGNOSIS — Z6831 Body mass index (BMI) 31.0-31.9, adult: Secondary | ICD-10-CM

## 2018-02-25 DIAGNOSIS — E1165 Type 2 diabetes mellitus with hyperglycemia: Secondary | ICD-10-CM

## 2018-02-25 DIAGNOSIS — R1010 Upper abdominal pain, unspecified: Secondary | ICD-10-CM

## 2018-02-25 DIAGNOSIS — N181 Chronic kidney disease, stage 1: Secondary | ICD-10-CM | POA: Insufficient documentation

## 2018-02-25 DIAGNOSIS — E7800 Pure hypercholesterolemia, unspecified: Secondary | ICD-10-CM

## 2018-02-25 DIAGNOSIS — E6609 Other obesity due to excess calories: Secondary | ICD-10-CM

## 2018-02-25 DIAGNOSIS — R197 Diarrhea, unspecified: Secondary | ICD-10-CM

## 2018-02-25 DIAGNOSIS — IMO0002 Reserved for concepts with insufficient information to code with codable children: Secondary | ICD-10-CM

## 2018-02-25 MED ORDER — ATORVASTATIN CALCIUM 20 MG PO TABS: 20.0000 mg | ORAL_TABLET | Freq: Every day | ORAL | 3 refills | Status: DC

## 2018-02-25 MED ORDER — LISINOPRIL 10 MG PO TABS: 10.0000 mg | ORAL_TABLET | Freq: Every day | ORAL | 3 refills | Status: DC

## 2018-02-25 NOTE — Chronic Care Management (AMB) (Signed)
  Care Management   Note  02/25/2018 Name: MERLEAN SEKHON MRN: [id number] DOB: 10-11-1975    Sinclair Grooms is a 43 year old female who sees [name], FNP for primary care. Ms. Annye Asa asked the CCM team to consult the patient for diabetes education and management. Referral was placed 02/23/2018 during patients last office visit   Was unable to reach patient via telephone today for introduction to CCM services. I have left HIPAA compliant voicemail asking patient to return my call. (unsuccessful outreach #[phone]).   Plan: Will follow-up within 7 business days via telephone.     Addendum: Incoming call from Ms. Mangus at 2:55 pm. Introduction to Care Management services provided. She continues to work and would like to initially focus on assistance with Freestyle Librae set up and use. She states she has seen an endocrinologist in the past and aware of all the lifestyle modification and diet changes she needs to make.  Ms. Demayo was given information about Care Management services today including:  1. Case Management services include personalized support from designated clinical staff supervised by a physician, including individualized plan of care and coordination with other care providers 2. 24/7 contact phone numbers for assistance for urgent and routine care needs. 3. The patient may stop CCM services at any time (effective at the end of the month) by phone call to the office staff.  Patient agreed to services and verbal consent obtained.   Plan: Initial face to face scheduled for Monday 02/28/2018 at 2:00    Donnis Phaneuf E. Suzie Portela, RN, BSN Nurse Care Coordinator [facility] Care Management  [phone]

## 2018-02-25 NOTE — Patient Instructions (Addendum)
1. Thank You for allowing the CCM (Chronic Care Management) Team to assist you with your healthcare goals!! We look forward to meeting you on 02/28/2018 at 2:00 2. Please bring ALL medications to your appointment! If you have a blood sugar meter or a blood pressure monitor at home, bring those as well. Also do not forget your Freestyle 3.  Contact the CCM Team if you have any question or need to reschedule your initial visit.  CCM (Chronic Care Management) Team   Yvone Neu RN, BSN Nurse Care Coordinator  [phone]  Karalee Height PharmD  Clinical Pharmacist  [phone]   Laura Mitchell was given information about Care Management services today including:  1. Case Management services includes personalized support from designated clinical staff supervised by her physician, including individualized plan of care and coordination with other care providers 2. 24/7 contact phone numbers for assistance for urgent and routine care needs. 3. The patient may stop case management services at any time by phone call to the office staff.  Patient agreed to services and verbal consent obtained.

## 2018-02-28 ENCOUNTER — Ambulatory Visit: Payer: BLUE CROSS/BLUE SHIELD | Admitting: Pharmacist

## 2018-02-28 ENCOUNTER — Ambulatory Visit: Payer: BLUE CROSS/BLUE SHIELD

## 2018-02-28 DIAGNOSIS — N181 Chronic kidney disease, stage 1: Secondary | ICD-10-CM

## 2018-02-28 DIAGNOSIS — E119 Type 2 diabetes mellitus without complications: Secondary | ICD-10-CM

## 2018-02-28 DIAGNOSIS — Z6831 Body mass index (BMI) 31.0-31.9, adult: Secondary | ICD-10-CM

## 2018-02-28 DIAGNOSIS — E6609 Other obesity due to excess calories: Secondary | ICD-10-CM

## 2018-02-28 NOTE — Chronic Care Management (AMB) (Signed)
Care Management   Note  02/28/2018 Name: Laura Mitchell MRN: [id number] DOB: [birth year]   Subjective:   Does the patient  feel that his/her medications are working for him/her?  yes  Has the patient been experiencing any side effects to the medications prescribed?  Yes- reports headaches with Xultophy  Does the patient measure his/her own blood glucose at home?  yes   Does the patient measure his/her own blood pressure at home? no   Does the patient have any problems obtaining medications due to transportation or finances?   no  Understanding of regimen: fair Understanding of indications: fair Potential of compliance: good  Objective: Lab Results  Component Value Date   CREATININE 0.74 02/23/2018   CREATININE 0.50 09/03/2017    Lab Results  Component Value Date   HGBA1C 12.8 (A) 02/23/2018    Lipid Panel     Component Value Date/Time   CHOL 167 02/23/2018 1006   TRIG 203 (H) 02/23/2018 1006   HDL 51 02/23/2018 1006   CHOLHDL 3.3 02/23/2018 1006   LDLCALC 87 02/23/2018 1006    BP Readings from Last 3 Encounters:  02/23/18 118/76  01/20/18 112/72  09/03/17 122/81    Allergies  Allergen Reactions  . Metformin And Related Nausea And Vomiting    Medications Reviewed Today    Reviewed by Laura Mitchell, [facility] (Pharmacist) on 02/28/18 at 1418  Med List Status: <None>  Medication Order Taking? Sig Documenting Provider Last Dose Status Informant  atorvastatin (LIPITOR) 20 MG tablet [id number] Yes Take 1 tablet (20 mg total) by mouth daily. Laura Custard, FNP Taking Active   Continuous Blood Gluc Sensor (FREESTYLE LIBRE 14 DAY SENSOR) [place] [id number] Yes 1 each by Does not apply route every 14 (fourteen) days. Laura Custard, FNP Taking Active   Continuous Blood Gluc Sensor (FREESTYLE LIBRE SENSOR SYSTEM) MISC [id number] Yes Apply sensor per directions. Laura Custard, FNP Taking Active   dicyclomine (BENTYL) 10 MG capsule [id number] Yes Take 1 capsule (10 mg total)  by mouth 3 (three) times daily as needed for spasms. Laura Custard, FNP Taking Active            Med Note Laura Mitchell Feb 28, 2018  2:16 PM) PRN   Insulin Degludec-Liraglutide (XULTOPHY) 100-3.6 UNIT-MG/ML SOPN [id number] Yes Inject 12-20 Units into the skin daily. Laura Custard, FNP Taking Active            Med Note Laura Mitchell Feb 28, 2018  2:11 PM) 2/24: Day 2 of 15 un  Stop at 20 un  Insulin Pen Needle (NOVOFINE) 32G X 6 MM MISC [id number] Yes 1 each by Does not apply route once a week. Laura Custard, FNP Taking Active   lisinopril (PRINIVIL,ZESTRIL) 10 MG tablet [id number] Yes Take 1 tablet (10 mg total) by mouth daily. Laura Custard, FNP Taking Active            Assessment: Comprehensive medication review performed. Counseled patient on proper administration, use, mechanism, and side effects of all medications. No drug related problems at this time. No issues with financials. Reports headaches possibly related to Xultophy. Recommend drinking water regularly and monitoring headaches for the next 1 month.    Goals Addressed            This Visit's Progress   . "I don't know much about my medicine" (pt-stated)       Current Barriers:  Marland Kitchen Knowledge  Deficits related to medication use and indication  Pharmacist Clinical Goal(s):  Marland Kitchen Over the next 30 days, patient will demonstrate improved understanding of prescribed medications and rationale for usage as evidenced by good compliance and utilization of resources  Interventions: . Comprehensive medication review performed.  . Patient educated on purpose, proper use and potential adverse effects of all medications.   .  Patient Self Care Activities:  . Self administers medications as prescribed . Attends all scheduled provider appointments . Calls provider office for new concerns or questions  Plan: . Patient will take all medications as prescribed . PharmD willfollow up in 30 days  *initial goal  documentation          Time:  Time spent counseling patient: 15 min Additional time spent on charting: 5 min Review of patient status, including review of consultants reports, laboratory and other test data, was performed as part of comprehensive evaluation and provision of chronic care management services.     Follow Up Plan: Telephone follow up appointment with CCM team member scheduled for: 03/03/18  PharmD to follow up in 30 days  Laura Mitchell, PharmD Clinical Pharmacist [facility] [phone]

## 2018-02-28 NOTE — Patient Instructions (Signed)
  Thank you allowing the Chronic Care Management Team to be a part of your care! It was a pleasure speaking with you today!  1. Continue to take all medications as prescribed. Please let Raynelle Fanning (pharmacist) know if she can assist with medication affordibility 2. Scan your blood sugar twice daily. Once each morning before breakfast and once just before evening meal 3. Review all provided information regarding diet and carb counting 4. Exercise is a great way for you to lower your blood sugar. Try to incorporate walking into your daily routine. Also drink plenty of water. 5. Do Not Skip Meals! You can incorporate low carb protein powder into your coffee in the mornings. 6. Please read you users manual for your FreeStyle [place]. I will follow up with you in a couple of days.   CCM (Chronic Care Management) Team   Yvone Neu RN, BSN Nurse Care Coordinator  [phone]  Karalee Height PharmD  Clinical Pharmacist  [phone]   Goals Addressed            This Visit's Progress   . "I really need to get my blood sugar controlled" (pt-stated)       Current Barriers:  Marland Kitchen Knowledge Deficits related to understanding how to better manage DM through self care and diet  Nurse Case Manager Clinical Goal(s):  Marland Kitchen Over the next 14 days, patient will verbalize basic understanding of DM disease process and self health management plan as evidenced by adhereing to ADA diet, checking blood sugars as prescribed utilizing [facility], and reporting medication adherence  Interventions:  . Discussed plans with patient for ongoing care management follow up and provided patient with direct contact information for care management team . Evaluation of current treatment plan related to DM self care and patient's adherence to plan as established by provider. . Provided patient with written and verbal educational materials related to Diabetes  . Reviewed Freestyle Libre with patient and assisted with set up  and sensor placement  Patient Self Care Activities:  . Self administers medications as prescribed . Attends all scheduled provider appointments . Calls provider office for new concerns or questions  . Check blood sugars independently and notify [name] with sugars >400 and <70  Plan:  . Patient will take all medications as prescribed, check blood sugars BID and record, adhere to ADA diet   *initial goal documentation    Print copy of patient instructions provided.   Telephone follow up appointment with CCM team member scheduled for: 2 days

## 2018-02-28 NOTE — Chronic Care Management (AMB) (Signed)
Care Management   Initial Visit Note  02/28/2018 Name: Laura Mitchell MRN: [id number] DOB: 1976/01/04   Subjective: "I just quit taking medication 2 years ago when we moved and I really need to lower my sugars"  Objective:  Lab Results  Component Value Date   HGBA1C 12.8 (A) 02/23/2018    Assessment: Laura Mitchell is a 43 year old femalewho sees [name], FNP for primary care. Laura Mitchell the CCM team to consult the patient for diabetes education and management. Referral was placed2/19/2020 during patients last office visit  Review of patient status, including review of consultants reports, relevant laboratory and other test results, and collaboration with appropriate care team members and the patient's provider was performed as part of comprehensive patient evaluation and provision of chronic care management services.    <no information>  Goals Addressed            This Visit's Progress   . "I really need to get my blood sugar controlled" (pt-stated)       Laura Mitchell states she has been diabetic for several years. She was lost to follow up when she moved and has not taken any medications for 2 years. 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 She needs education that takes into consideration, eating on the go and being a caregiver to her three children and soon to be grandchild who will reside with her. She has had some diabetes education I the past but admits to not being comfortable with what she currently knows about the disease process. She also states she lost her mom to complications of uncontrolled DM and heart disease.  Current Barriers:  Marland Kitchen Knowledge Deficits related to understanding how to better manage DM through self care  Nurse Case Manager Clinical Goal(s):  Marland Kitchen Over the next 14 days, patient will verbalize basic understanding of DM disease process and self health management plan as evidenced by adhereing to ADA diet, checking blood sugars  as prescribed utilizing [facility], and reporting medication adherence  Interventions:  . Discussed plans with patient for ongoing care management follow up and provided patient with direct contact information for care management team . Evaluation of current treatment plan related to DM self care and patient's adherence to plan as established by provider. . Provided patient with written and verbal educational materials related to Diabetes  . Reviewed Freestyle Libre with patient and assisted with set up and sensor placement  Patient Self Care Activities:  . Self administers medications as prescribed . Attends all scheduled provider appointments . Calls provider office for new concerns or questions  . Check blood sugars independently and notify [name] with sugars >400 and <70  Plan:  . Patient will take all medications as prescribed, check blood sugars BID and record, adhere to ADA diet   *initial goal documentation     Follow up plan:  Telephone follow up appointment with CCM team member scheduled for: 2 days  Laura Mitchell was given information about Care Management services today including:  1. Case Management services include personalized support from designated clinical staff supervised by a physician, including individualized plan of care and coordination with other care providers 2. 24/7 contact phone numbers for assistance for urgent and routine care needs. 3. The patient may stop CCM services at any time (effective at the end of the month) by phone call to the office staff.  Patient agreed to services and verbal consent obtained.    Laura Mitchell  Laura Czech, RN, BSN Nurse Care Coordinator [facility] Care Management  [phone]

## 2018-02-28 NOTE — Patient Instructions (Signed)
Information about your medication: GLP-1 receptor agonist  Generic Name (Brand): liraglutide (Victoza and Xultophy)   GLP-1 receptor agonists bind with the GLP-1 receptor in the pancreas, which will stimulate insulin release. The GLP-1 agonists also delay stomach emptying.   Common SIDE EFFECTS you should be aware of include: nausea, vomiting, diarrhea, or abdominal pain. For most people, these side effects will go away after the first 3 injections. Many people also lose weight.   Inject into abdomen, thighs, or upper arms, once weekly. Rotate injection sites from week to week.   You and your health care provider may need to check your blood sugars, A1c, and kidney function regularly.    Thank you allowing the Chronic Care Management Team to be a part of your care!   Please call a member of the CCM (Chronic Care Management) Team with any questions or case management needs:   Arthur Holms, BSN Nurse Care Coordinator  [phone]  Karalee Height, PharmD  Clinical Pharmacist  [phone]

## 2018-03-02 ENCOUNTER — Encounter: Payer: Self-pay | Admitting: Gastroenterology

## 2018-03-03 ENCOUNTER — Ambulatory Visit: Payer: Self-pay

## 2018-03-03 NOTE — Chronic Care Management (AMB) (Signed)
  Care Management   Follow Up Note   03/03/2018 Name: Laura Mitchell MRN: [id number] DOB: December 18, 1975  Referred by: Hubbard Hartshorn, FNP Reason for referral : Chronic Care Management (follow up for [facility] support)    Subjective: "I really like this freestyle libre" "I am feeling a lot more full when I eat"   Objective:  Lab Results  Component Value Date   HGBA1C 12.8 (A) 02/23/2018     Assessment: Laura Mitchell a 43year old femalewho seesEmily Uvaldo Rising, FNPfor primary care. Laura Mitchell the CCM team to consult the patient fordiabetes education and management.Referral was placed2/19/2020 during patients last office visit. The CCM Team met with Laura Mitchell 02/28/2018 to establish health goals. Today, CCM RN CM followed up with Laura Mitchell to assess for questions, concerns, or needs regarding usage of FreeStyle Libre and DM eduction.  Review of patient status, including review of consultants reports, relevant laboratory and other test results, and collaboration with appropriate care team members and the patient's provider was performed as part of comprehensive patient evaluation and provision of chronic care management services.    Goals Addressed            This Visit's Progress   . "I really need to get my blood sugar controlled" (pt-stated)   On track    Laura Mitchell states she is "loving" her [employer]. She is checking her sugars at appropriate times in related to meals so her interstitial sugars are closely related to her blood sugars. Her am fasting sugars average 200 however her pre-meal sugars range between 120-140. She is adhering to an ADA diet monitoring her carb intake and she is feeling more satisfied with smaller portions. She relates this to [employer]. She is motivated to make changes to her health. She request follow up in 1 month.   Current Barriers:  Marland Kitchen Knowledge Deficits related to understanding how to better manage DM through self care  Nurse Case  Manager Clinical Goal(s):  Marland Kitchen Over the next 30 days, patient will verbalize basic understanding of DM disease process and self health management plan as evidenced by adhereing to ADA diet, checking blood sugars as prescribed utilizing [facility], and reporting medication adherence  Interventions:  . Discussed plans with patient for ongoing care management follow up and provided patient with direct contact information for care management team . Evaluation of current treatment plan related to DM self care and patient's adherence to plan as established by provider. . Provided patient with written and verbal educational materials related to Diabetes  . Reviewed Freestyle Libre with patient and assisted with set up and sensor placement  Patient Self Care Activities:  . Self administers medications as prescribed . Attends all scheduled provider appointments . Calls provider office for new concerns or questions  . Check blood sugars independently and notify Raelyn Ensign with sugars >400 and <70  Plan:  . Patient will take all medications as prescribed, check blood sugars BID and record, adhere to ADA diet   *initial goal documentation      Follow Up Plan: CCM RN CM will follow up with patient in 30 days     Phinneas Shakoor E. Rollene Rotunda, RN, BSN Nurse Care Coordinator [facility] Care Management  [phone]

## 2018-03-03 NOTE — Patient Instructions (Signed)
  Thank you allowing the Chronic Care Management Team to be a part of your care! It was a pleasure speaking with you today!  1. Continue to check your sugars as discussed. I am very happy you are pleased with your Josephine Igo! 2. Take your medications as directed 3. Reduce your portions sizes if you are feeling too full with meals. 4. Continue to monitor your carb intake and adhere to the diet we discussed. 5. Please call CCM Team with any question or concerns. You do not have to wait for our schedule call.   CCM (Chronic Care Management) Team   Yvone Neu RN, BSN Nurse Care Coordinator  [phone]  Karalee Height PharmD  Clinical Pharmacist  [phone]   Goals Addressed            This Visit's Progress   . "I really need to get my blood sugar controlled" (pt-stated)   On track    Current Barriers:  Marland Kitchen Knowledge Deficits related to understanding how to better manage DM through self care  Nurse Case Manager Clinical Goal(s):  Marland Kitchen Over the next 30 days, patient will verbalize basic understanding of DM disease process and self health management plan as evidenced by adhereing to ADA diet, checking blood sugars as prescribed utilizing [facility], and reporting medication adherence  Interventions:  . Discussed plans with patient for ongoing care management follow up and provided patient with direct contact information for care management team . Evaluation of current treatment plan related to DM self care and patient's adherence to plan as established by provider. . Provided patient with written and verbal educational materials related to Diabetes  . Reviewed Freestyle Libre with patient and assisted with set up and sensor placement  Patient Self Care Activities:  . Self administers medications as prescribed . Attends all scheduled provider appointments . Calls provider office for new concerns or questions  . Check blood sugars independently and notify [name] with sugars  >400 and <70  Plan:  . Patient will take all medications as prescribed, check blood sugars BID and record, adhere to ADA diet   *initial goal documentation              The patient verbalized understanding of instructions provided today and declined a print copy of patient instruction materials.   Telephone follow up appointment with CCM team member scheduled for:  30 days

## 2018-03-10 ENCOUNTER — Ambulatory Visit (INDEPENDENT_AMBULATORY_CARE_PROVIDER_SITE_OTHER): Payer: BLUE CROSS/BLUE SHIELD | Admitting: Family Medicine

## 2018-03-10 ENCOUNTER — Encounter: Payer: Self-pay | Admitting: Family Medicine

## 2018-03-10 VITALS — BP 100/70 | HR 91 | Temp 97.5°F | Resp 16 | Ht 64.5 in | Wt 183.0 lb

## 2018-03-10 DIAGNOSIS — Z6831 Body mass index (BMI) 31.0-31.9, adult: Secondary | ICD-10-CM

## 2018-03-10 DIAGNOSIS — E1121 Type 2 diabetes mellitus with diabetic nephropathy: Secondary | ICD-10-CM

## 2018-03-10 DIAGNOSIS — E1165 Type 2 diabetes mellitus with hyperglycemia: Secondary | ICD-10-CM

## 2018-03-10 DIAGNOSIS — E6609 Other obesity due to excess calories: Secondary | ICD-10-CM | POA: Diagnosis not present

## 2018-03-10 DIAGNOSIS — Z23 Encounter for immunization: Secondary | ICD-10-CM

## 2018-03-10 DIAGNOSIS — IMO0002 Reserved for concepts with insufficient information to code with codable children: Secondary | ICD-10-CM

## 2018-03-10 NOTE — Patient Instructions (Signed)
Continue to titrate your Xultophy every 2-3 days to a goal blood sugar of 100-140 fasting (first thing in the morning).

## 2018-03-10 NOTE — Progress Notes (Signed)
Name: Laura Mitchell   MRN: [id number]    DOB: 12-22-1975   Date:03/10/2018       Progress Note  Subjective  Chief Complaint  Chief Complaint  Patient presents with  . Diabetes    2 week follow up    HPI  Pt presents for DM follow up.  Last A1C was 02/28/2018 with A1C 12.8%.  She is now on 20 units of Xultophy. Highest BG has been 223, lowest was 114.  She is continuing to titrate because her BG's have been around 200 fasting.  We will have her continue titration.  Metformin made her sick and cannot take.  No polyphagia, polydipsia, or polyuria.  She denies nausea, does have increased fullness and some mild constipation.  She has ongoing abdominal pain and is seeing GI in 2 weeks for this.  She is taking ACE-I, and statin medication.  She has met with our CCM team for DM education multiple times and has the freestyle libre in place and is using it appropriately.  Patient Active Problem List   Diagnosis Date Noted  . Uncontrolled type 2 diabetes mellitus with microalbuminuric diabetic nephropathy ([place]) 02/25/2018  . CKD stage G1/A2, GFR > 90 and albumin creatinine ratio 30-299 mg/g 02/25/2018  . Class 1 obesity due to excess calories with serious comorbidity and body mass index (BMI) of 31.0 to 31.9 in adult 02/23/2018  . Pain of upper abdomen 02/23/2018  . Diarrhea 02/23/2018    Past Surgical History:  Procedure Laterality Date  . CESAREAN SECTION    . CHOLECYSTECTOMY      Family History  Problem Relation Age of Onset  . Diabetes Mother   . Kidney disease Mother   . Heart disease Mother   . Diabetes Father   . Arthritis Father   . Heart disease Maternal Grandmother   . Heart disease Maternal Grandfather   . Alzheimer's disease Paternal Grandmother     Social History   Socioeconomic History  . Marital status: Married    Spouse name: marty  . Number of children: 3  . Years of education: Not on file  . Highest education level: Not on file  Occupational History  .  Not on file  Social Needs  . Financial resource strain: Not on file  . Food insecurity:    Worry: Never true    Inability: Never true  . Transportation needs:    Medical: No    Non-medical: No  Tobacco Use  . Smoking status: Never Smoker  . Smokeless tobacco: Never Used  Substance and Sexual Activity  . Alcohol use: Yes    Comment: occasional  . Drug use: Never  . Sexual activity: Yes    Partners: Male    Birth control/protection: Surgical  Lifestyle  . Physical activity:    Days per week: 0 days    Minutes per session: 0 min  . Stress: Not at all  Relationships  . Social connections:    Talks on phone: More than three times a week    Gets together: More than three times a week    Attends religious service: Never    Active member of club or organization: No    Attends meetings of clubs or organizations: Never    Relationship status: Married  . Intimate partner violence:    Fear of current or ex partner: No    Emotionally abused: No    Physically abused: No    Forced sexual activity: No  Other  Topics Concern  . Not on file  Social History Narrative   Lives at home with her husband and three children Loistine Simas- daughter, 107yo duaghter, 71yo son).    Current Outpatient Medications:  .  atorvastatin (LIPITOR) 20 MG tablet, Take 1 tablet (20 mg total) by mouth daily., Disp: 90 tablet, Rfl: 3 .  Continuous Blood Gluc Sensor (FREESTYLE LIBRE 14 DAY SENSOR) MISC, 1 each by Does not apply route every 14 (fourteen) days., Disp: 1 each, Rfl: 2 .  Continuous Blood Gluc Sensor ([place]) MISC, Apply sensor per directions., Disp: 1 each, Rfl: 0 .  dicyclomine (BENTYL) 10 MG capsule, Take 1 capsule (10 mg total) by mouth 3 (three) times daily as needed for spasms., Disp: 20 capsule, Rfl: 0 .  Insulin Degludec-Liraglutide (XULTOPHY) 100-3.6 UNIT-MG/ML SOPN, Inject 12-20 Units into the skin daily., Disp: 15 mL, Rfl: 1 .  Insulin Pen Needle (NOVOFINE) 32G X 6 MM MISC,  1 each by Does not apply route once a week., Disp: 100 each, Rfl: 0 .  lisinopril (PRINIVIL,ZESTRIL) 10 MG tablet, Take 1 tablet (10 mg total) by mouth daily., Disp: 90 tablet, Rfl: 3  Allergies  Allergen Reactions  . Metformin And Related Nausea And Vomiting    I personally reviewed active problem list, medication list, allergies, notes from last encounter, lab results with the patient/caregiver today.   ROS  Constitutional: Negative for fever or weight change.  Respiratory: Negative for cough and shortness of breath.   Cardiovascular: Negative for chest pain or palpitations.  Gastrointestinal: Negative for abdominal pain, no bowel changes.  Musculoskeletal: Negative for gait problem or joint swelling.  Skin: Negative for rash.  Neurological: Negative for dizziness or headache.  No other specific complaints in a complete review of systems (except as listed in HPI above).  Objective  Vitals:   03/10/18 0725  BP: 100/70  Pulse: 91  Resp: 16  Temp: (!) 97.5 F (36.4 C)  TempSrc: Oral  SpO2: 99%  Weight: 183 lb (83 kg)  Height: 5' 4.5" (1.638 m)   Body mass index is 30.93 kg/m.  Physical Exam  Constitutional: Patient appears well-developed and well-nourished. No distress.  HENT: Head: Normocephalic and atraumatic. Eyes: Conjunctivae and EOM are normal. No scleral icterus.  Neck: Normal range of motion. Neck supple. No JVD present. No thyromegaly present.  Cardiovascular: Normal rate, regular rhythm and normal heart sounds.  No murmur heard. No BLE edema. Pulmonary/Chest: Effort normal and breath sounds normal. No respiratory distress. Abdominal: Soft. Bowel sounds are normal, no distension. There is no tenderness. No masses. Musculoskeletal: Normal range of motion, no joint effusions. No gross deformities Neurological: Pt is alert and oriented to person, place, and time. No cranial nerve deficit. Coordination, balance, strength, speech and gait are normal.  Skin: Skin  is warm and dry. No rash noted. No erythema.  Psychiatric: Patient has a normal mood and affect. behavior is normal. Judgment and thought content normal.  No results found for this or any previous visit (from the past 72 hour(s)).  Diabetic Foot Exam: Diabetic Foot Exam - Simple   Simple Foot Form Diabetic Foot exam was performed with the following findings:  Yes 03/10/2018  7:48 AM  Visual Inspection No deformities, no ulcerations, no other skin breakdown bilaterally:  Yes Sensation Testing Intact to touch and monofilament testing bilaterally:  Yes Pulse Check Posterior Tibialis and Dorsalis pulse intact bilaterally:  Yes Comments    PHQ2/9: Depression screen [facility] 2/9 03/10/2018 02/23/2018  Decreased  Interest 0 0  Down, Depressed, Hopeless 0 0  PHQ - 2 Score 0 0  Altered sleeping 0 0  Tired, decreased energy 0 0  Change in appetite 0 0  Feeling bad or failure about yourself  0 0  Trouble concentrating 0 0  Moving slowly or fidgety/restless 0 0  Suicidal thoughts 0 0  PHQ-9 Score 0 0  Difficult doing work/chores - Not difficult at all   Fall Risk: Fall Risk  03/10/2018 02/23/2018  Falls in the past year? 0 0  Number falls in past yr: 0 0  Injury with Fall? 0 0  Follow up - Falls evaluation completed    Assessment & Plan  1. Uncontrolled type 2 diabetes mellitus with microalbuminuric diabetic nephropathy ([place]) - Congratulated on progress and using medications correctly.  - Tdap vaccine greater than or equal to 7yo IM - Pneumococcal polysaccharide vaccine 23-valent greater than or equal to 2yo subcutaneous/IM  2. Class 1 obesity due to excess calories with serious comorbidity and body mass index (BMI) of 31.0 to 31.9 in adult - Discussed importance of 150 minutes of physical activity weekly, eat two servings of fish weekly, eat one serving of tree nuts ( cashews, pistachios, pecans, almonds.Marland Kitchen) every other day, eat 6 servings of fruit/vegetables daily and drink plenty of water  and avoid sweet beverages.   3. Need for Tdap vaccination - Tdap vaccine greater than or equal to 7yo IM  4. Need for vaccination for pneumococcus - Pneumococcal polysaccharide vaccine 23-valent greater than or equal to 2yo subcutaneous/IM

## 2018-03-14 ENCOUNTER — Encounter: Payer: Self-pay | Admitting: Family Medicine

## 2018-03-18 ENCOUNTER — Encounter: Payer: Self-pay | Admitting: Family Medicine

## 2018-03-18 ENCOUNTER — Other Ambulatory Visit: Payer: Self-pay

## 2018-03-18 ENCOUNTER — Ambulatory Visit (INDEPENDENT_AMBULATORY_CARE_PROVIDER_SITE_OTHER): Payer: BLUE CROSS/BLUE SHIELD | Admitting: Family Medicine

## 2018-03-18 VITALS — BP 108/72 | HR 93 | Temp 98.0°F | Resp 14 | Ht 65.0 in | Wt 182.7 lb

## 2018-03-18 DIAGNOSIS — E119 Type 2 diabetes mellitus without complications: Secondary | ICD-10-CM

## 2018-03-18 DIAGNOSIS — M722 Plantar fascial fibromatosis: Secondary | ICD-10-CM

## 2018-03-18 MED ORDER — FREESTYLE LIBRE 14 DAY SENSOR MISC: 1.0000 | 3 refills | Status: DC

## 2018-03-18 MED ORDER — MELOXICAM 15 MG PO TABS: 7.5000 mg | ORAL_TABLET | Freq: Every day | ORAL | 0 refills | Status: DC

## 2018-03-18 NOTE — Patient Instructions (Signed)

## 2018-03-18 NOTE — Progress Notes (Signed)
Name: Laura Mitchell   MRN: [id number]    DOB: [birth year]   Date:03/18/2018       Progress Note  Subjective  Chief Complaint  Chief Complaint  Patient presents with  . Foot evaluation    HPI  Pt presents with concern for bilateral foot pain that has been ongoing for a few years.  The pain is worse first thing in the morning and after a lot of walking throughout the day.  She has never sought treatment for this in the past, last diabetic foot exam at last visit was normal for sensation and no ulceration.  Patient Active Problem List   Diagnosis Date Noted  . Uncontrolled type 2 diabetes mellitus with microalbuminuric diabetic nephropathy ([facility]) 02/25/2018  . CKD stage G1/A2, GFR > 90 and albumin creatinine ratio 30-299 mg/g 02/25/2018  . Class 1 obesity due to excess calories with serious comorbidity and body mass index (BMI) of 31.0 to 31.9 in adult 02/23/2018  . Pain of upper abdomen 02/23/2018  . Diarrhea 02/23/2018    Social History   Tobacco Use  . Smoking status: Never Smoker  . Smokeless tobacco: Never Used  Substance Use Topics  . Alcohol use: Yes    Comment: occasional     Current Outpatient Medications:  .  atorvastatin (LIPITOR) 20 MG tablet, Take 1 tablet (20 mg total) by mouth daily., Disp: 90 tablet, Rfl: 3 .  dicyclomine (BENTYL) 10 MG capsule, Take 1 capsule (10 mg total) by mouth 3 (three) times daily as needed for spasms., Disp: 20 capsule, Rfl: 0 .  Insulin Degludec-Liraglutide (XULTOPHY) 100-3.6 UNIT-MG/ML SOPN, Inject 12-20 Units into the skin daily., Disp: 15 mL, Rfl: 1 .  lisinopril (PRINIVIL,ZESTRIL) 10 MG tablet, Take 1 tablet (10 mg total) by mouth daily., Disp: 90 tablet, Rfl: 3 .  Continuous Blood Gluc Sensor (FREESTYLE LIBRE 14 DAY SENSOR) MISC, 1 each by Does not apply route every 14 (fourteen) days., Disp: 1 each, Rfl: 2 .  Continuous Blood Gluc Sensor (FREESTYLE LIBRE SENSOR SYSTEM) MISC, Apply sensor per directions., Disp: 1 each, Rfl: 0 .   Insulin Pen Needle (NOVOFINE) 32G X 6 MM MISC, 1 each by Does not apply route once a week., Disp: 100 each, Rfl: 0  Allergies  Allergen Reactions  . Metformin And Related Nausea And Vomiting    I personally reviewed active problem list, medication list, allergies, notes from last encounter, lab results with the patient/caregiver today.  ROS  Ten systems reviewed and is negative except as mentioned in HPI.  Objective  Vitals:   03/18/18 1014  BP: 108/72  Pulse: 93  Resp: 14  Temp: 98 F (36.7 C)  SpO2: 98%  Weight: 182 lb 11.2 oz (82.9 kg)  Height: 5\' 5"  (1.651 m)   Body mass index is 30.4 kg/m.  Nursing Note and Vital Signs reviewed.  Physical Exam  Constitutional: Patient appears well-developed and well-nourished. Obese No distress.  HEENT: head atraumatic, normocephalic, pupils equal and reactive to light, Bilateral TM's without erythema or effusion,  bilateral maxillary and frontal sinuses are non-tender, neck supple without lymphadenopathy, throat within normal limits - no erythema or exudate, no tonsillar swelling Cardiovascular: Normal rate, regular rhythm and normal heart sounds.  No murmur heard. No BLE edema. Pulmonary/Chest: Effort normal and breath sounds clear bilaterally. No respiratory distress. Psychiatric: Patient has a normal mood and affect. behavior is normal. Judgment and thought content normal. MSK: Tenderness on bilateral heels with deep palpation, otherwise MSK is unremarkable.  There is no foot ulceration.  No results found for this or any previous visit (from the past 72 hour(s)).  Assessment & Plan  1. Plantar fasciitis - Letter is created to allow for tennis shoes at work instead of dress shoes.  See AVS for additional teaching including stretching and rolling frozen waterbottle on foot at night. - meloxicam (MOBIC) 15 MG tablet; Take 0.5-1 tablets (7.5-15 mg total) by mouth daily.  Dispense: 30 tablet; Refill: 0

## 2018-03-21 ENCOUNTER — Encounter: Payer: Self-pay | Admitting: Gastroenterology

## 2018-03-21 ENCOUNTER — Other Ambulatory Visit: Payer: Self-pay

## 2018-03-21 ENCOUNTER — Ambulatory Visit (INDEPENDENT_AMBULATORY_CARE_PROVIDER_SITE_OTHER): Payer: BLUE CROSS/BLUE SHIELD | Admitting: Gastroenterology

## 2018-03-21 VITALS — BP 104/71 | HR 84 | Ht 65.0 in | Wt 182.0 lb

## 2018-03-21 DIAGNOSIS — R1013 Epigastric pain: Secondary | ICD-10-CM | POA: Diagnosis not present

## 2018-03-21 DIAGNOSIS — R112 Nausea with vomiting, unspecified: Secondary | ICD-10-CM

## 2018-03-21 MED ORDER — OMEPRAZOLE 40 MG PO CPDR: 40.0000 mg | DELAYED_RELEASE_CAPSULE | Freq: Two times a day (BID) | ORAL | 2 refills | Status: DC

## 2018-03-21 NOTE — Progress Notes (Signed)
Arlyss Repress, MD [address]  [address]  [place], [place] [address]  Main: [phone]  Fax: [phone]    Gastroenterology Consultation  Referring Provider:     Doren Custard, FNP Primary Care Physician:  Doren Custard, FNP Primary Gastroenterologist:  Dr. Arlyss Repress Reason for Consultation:     Dyspepsia        HPI:   Laura Mitchell is a 43 y.o. female referred by Dr. Annye Asa, Gerome Apley, FNP  for consultation & management of chronic upper abdominal pain, predominantly in the epigastric region started approximately 2 years ago, but worse in last few months.  She was in [place] 2 years ago and underwent work-up for epigastric pain, EGD and a colonoscopy which were unremarkable.  She also underwent laparoscopic cholecystectomy in 2010.  She moved to [place] with her husband and 3 adults and children.  She gained significant weight at that time when she was coping with stress from her mother passing away in March 2019.  She has type 2 diabetes for past 10 years, mostly not been under control on insulin.  Her latest hemoglobin A1c is 12.5.  The upper abdominal pain that she describes as severe discomfort, says " it hurts really bad" not stabbing or burning in nature, not associated with radiation.  Associated with early satiety.  She denies abdominal bloating, she denies nausea or vomiting.  She used to experience postprandial diarrhea, nonbloody.  H. pylori breath test negative, lipase and CBC, CMP unremarkable.  She tried Tums, Pepcid which not provide any relief.  She has been recently started on an insulin derivative called Xultophy and noticed that she is experiencing constipation.  Having bowel movement about once a week.  She lost about 20 pounds.  NSAIDs: Recently started on meloxicam for foot pain  Antiplts/Anticoagulants/Anti thrombotics: None  GI Procedures: EGD and colonoscopy in [place] in 2018, reportedly unremarkable Aunt with colon cancer  Past Medical  History:  Diagnosis Date  . Diabetes mellitus without complication [facility])     Past Surgical History:  Procedure Laterality Date  . CESAREAN SECTION    . CHOLECYSTECTOMY       Current Outpatient Medications:  .  atorvastatin (LIPITOR) 20 MG tablet, Take 1 tablet (20 mg total) by mouth daily., Disp: 90 tablet, Rfl: 3 .  Continuous Blood Gluc Sensor (FREESTYLE LIBRE 14 DAY SENSOR) MISC, 1 each by Does not apply route every 14 (fourteen) days., Disp: 6 each, Rfl: 3 .  dicyclomine (BENTYL) 10 MG capsule, Take 1 capsule (10 mg total) by mouth 3 (three) times daily as needed for spasms., Disp: 20 capsule, Rfl: 0 .  Insulin Degludec-Liraglutide (XULTOPHY) 100-3.6 UNIT-MG/ML SOPN, Inject 12-20 Units into the skin daily., Disp: 15 mL, Rfl: 1 .  Insulin Pen Needle (NOVOFINE) 32G X 6 MM MISC, 1 each by Does not apply route once a week., Disp: 100 each, Rfl: 0 .  lisinopril (PRINIVIL,ZESTRIL) 10 MG tablet, Take 1 tablet (10 mg total) by mouth daily., Disp: 90 tablet, Rfl: 3 .  meloxicam (MOBIC) 15 MG tablet, Take 0.5-1 tablets (7.5-15 mg total) by mouth daily., Disp: 30 tablet, Rfl: 0 .  omeprazole (PRILOSEC) 40 MG capsule, Take 1 capsule (40 mg total) by mouth 2 (two) times daily before a meal for 30 days., Disp: 60 capsule, Rfl: 2   Family History  Problem Relation Age of Onset  . Diabetes Mother   . Kidney disease Mother   . Heart disease Mother   .  Diabetes Father   . Arthritis Father   . Heart disease Maternal Grandmother   . Heart disease Maternal Grandfather   . Alzheimer's disease Paternal Grandmother      Social History   Tobacco Use  . Smoking status: Never Smoker  . Smokeless tobacco: Never Used  Substance Use Topics  . Alcohol use: Yes    Comment: occasional  . Drug use: Never    Allergies as of 03/21/2018 - Review Complete 03/21/2018  Allergen Reaction Noted  . Metformin and related Nausea And Vomiting 02/23/2018    Review of Systems:    All systems reviewed and  negative except where noted in HPI.   Physical Exam:  BP 104/71   Pulse 84   Ht 5\' 5"  (1.651 m)   Wt 182 lb (82.6 kg)   BMI 30.29 kg/m  No LMP recorded.  General:   Alert,  Well-developed, well-nourished, pleasant and cooperative in NAD Head:  Normocephalic and atraumatic. Eyes:  Sclera clear, no icterus.   Conjunctiva pink. Ears:  Normal auditory acuity. Nose:  No deformity, discharge, or lesions. Mouth:  No deformity or lesions,oropharynx pink & moist. Neck:  Supple; no masses or thyromegaly. Lungs:  Respirations even and unlabored.  Clear throughout to auscultation.   No wheezes, crackles, or rhonchi. No acute distress. Heart:  Regular rate and rhythm; no murmurs, clicks, rubs, or gallops. Abdomen:  Normal bowel sounds. Soft, mild tenderness in epigastric region predominantly and non-distended without masses, hepatosplenomegaly or hernias noted.  No guarding or rebound tenderness.   Rectal: Not performed Msk:  Symmetrical without gross deformities. Good, equal movement & strength bilaterally. Pulses:  Normal pulses noted. Extremities:  No clubbing or edema.  No cyanosis. Neurologic:  Alert and oriented x3;  grossly normal neurologically. Skin:  Intact without significant lesions or rashes. No jaundice. Lymph Nodes:  No significant cervical adenopathy. Psych:  Alert and cooperative. Normal mood and affect.  Imaging Studies: Reviewed  Assessment and Plan:   Laura Mitchell is a 43 y.o. female with obesity, uncontrolled diabetes mellitus for last 10 years on insulin, hemoglobin A1c 12.5 presents with 2 years history of upper abdominal pain associated with early satiety.  Worse in last 3 months.  Previously experiencing postprandial urgency, currently having constipation.  She had EGD and colonoscopy 2 years ago which were unremarkable.  H. pylori breath test negative.  CBC, CMP unremarkable.  Differentials include  gastroparesis secondary to diabetes or functional dyspepsia or  pancreas etiology  Dyspepsia, status post cholecystectomy and EGD Empiric trial of omeprazole 40 mg twice daily Nuclear medicine gastric emptying study Trial of gastroparesis diet Tight control of diabetes If pain is still persistent despite above, recommend CT abdomen to evaluate pancreas  Constipation Discussed with her about high-fiber diet Recommend soluble fiber supplements   Follow up in 2 months   Arlyss Repress, MD

## 2018-03-21 NOTE — Patient Instructions (Signed)
High-Fiber Diet Fiber, also called dietary fiber, is a type of carbohydrate that is found in fruits, vegetables, whole grains, and beans. A high-fiber diet can have many health benefits. Your health care provider may recommend a high-fiber diet to help: Prevent constipation. Fiber can make your bowel movements more regular. Lower your cholesterol. Relieve the following conditions: Swelling of veins in the anus (hemorrhoids). Swelling and irritation (inflammation) of specific areas of the digestive tract (uncomplicated diverticulosis). A problem of the large intestine (colon) that sometimes causes pain and diarrhea (irritable bowel syndrome, IBS). Prevent overeating as part of a weight-loss plan. Prevent heart disease, type 2 diabetes, and certain cancers. What is my plan? The recommended daily fiber intake in grams (g) includes: 38 g for men age 41 or younger. 30 g for men over age 53. 25 g for women age 78 or younger. 21 g for women over age 12. You can get the recommended daily intake of dietary fiber by: Eating a variety of fruits, vegetables, grains, and beans. Taking a fiber supplement, if it is not possible to get enough fiber through your diet. What do I need to know about a high-fiber diet? It is better to get fiber through food sources rather than from fiber supplements. There is not a lot of research about how effective supplements are. Always check the fiber content on the nutrition facts label of any prepackaged food. Look for foods that contain 5 g of fiber or more per serving. Talk with a diet and nutrition specialist (dietitian) if you have questions about specific foods that are recommended or not recommended for your medical condition, especially if those foods are not listed below. Gradually increase how much fiber you consume. If you increase your intake of dietary fiber too quickly, you may have bloating, cramping, or gas. Drink plenty of water. Water helps you to digest  fiber. What are tips for following this plan? Eat a wide variety of high-fiber foods. Make sure that half of the grains that you eat each day are whole grains. Eat breads and cereals that are made with whole-grain flour instead of refined flour or white flour. Eat brown rice, bulgur wheat, or millet instead of white rice. Start the day with a breakfast that is high in fiber, such as a cereal that contains 5 g of fiber or more per serving. Use beans in place of meat in soups, salads, and pasta dishes. Eat high-fiber snacks, such as berries, raw vegetables, nuts, and popcorn. Choose whole fruits and vegetables instead of processed forms like juice or sauce. What foods can I eat?  Fruits Berries. Pears. Apples. Oranges. Avocado. Prunes and raisins. Dried figs. Vegetables Sweet potatoes. Spinach. Kale. Artichokes. Cabbage. Broccoli. Cauliflower. Green peas. Carrots. Squash. Grains Whole-grain breads. Multigrain cereal. Oats and oatmeal. Brown rice. Barley. Bulgur wheat. Millet. Quinoa. Bran muffins. Popcorn. Rye wafer crackers. Meats and other proteins Navy, kidney, and pinto beans. Soybeans. Split peas. Lentils. Nuts and seeds. Dairy Fiber-fortified yogurt. Beverages Fiber-fortified soy milk. Fiber-fortified orange juice. Other foods Fiber bars. The items listed above may not be a complete list of recommended foods and beverages. Contact a dietitian for more options. What foods are not recommended? Fruits Fruit juice. Cooked, strained fruit. Vegetables Fried potatoes. Canned vegetables. Well-cooked vegetables. Grains White bread. Pasta made with refined flour. White rice. Meats and other proteins Fatty cuts of meat. Fried chicken or fried fish. Dairy Milk. Yogurt. Cream cheese. Sour cream. Fats and oils Butters. Beverages Soft drinks. Other foods Cakes  and pastries. The items listed above may not be a complete list of foods and beverages to avoid. Contact a dietitian for more  information. Summary Fiber is a type of carbohydrate. It is found in fruits, vegetables, whole grains, and beans. There are many health benefits of eating a high-fiber diet, such as preventing constipation, lowering blood cholesterol, helping with weight loss, and reducing your risk of heart disease, diabetes, and certain cancers. Gradually increase your intake of fiber. Increasing too fast can result in cramping, bloating, and gas. Drink plenty of water while you increase your fiber. The best sources of fiber include whole fruits and vegetables, whole grains, nuts, seeds, and beans. This information is not intended to replace advice given to you by your health care provider. Make sure you discuss any questions you have with your health care provider. Document Released: 12/22/2004 Document Revised: 10/26/2016 Document Reviewed: 10/26/2016 Elsevier Interactive Patient Education  2019 [employer]. Gastroparesis  Gastroparesis is a condition in which food takes longer than normal to empty from the stomach. The condition is usually long-lasting (chronic). It may also be called delayed gastric emptying. There is no cure, but there are treatments and things that you can do at home to help relieve symptoms. Treating the underlying condition that causes gastroparesis can also help relieve symptoms. What are the causes? In many cases, the cause of this condition is not known. Possible causes include:  A hormone (endocrine) disorder, such as hypothyroidism or diabetes.  A nervous system disease, such as Parkinson's disease or multiple sclerosis.  Cancer, infection, or surgery that affects the stomach or vagus nerve. The vagus nerve runs from your chest, through your neck, to the lower part of your brain.  A connective tissue disorder, such as scleroderma.  Certain medicines. What increases the risk? You are more likely to develop this condition if you:  Have certain disorders or diseases,  including: ? An endocrine disorder. ? An eating disorder. ? Amyloidosis. ? Scleroderma. ? Parkinson's disease. ? Multiple sclerosis. ? Cancer or infection of the stomach or the vagus nerve.  Have had surgery on the stomach or vagus nerve.  Take certain medicines.  Are female. What are the signs or symptoms? Symptoms of this condition include:  Feeling full after eating very little.  Nausea.  Vomiting.  Heartburn.  Abdominal bloating.  Inconsistent blood sugar (glucose) levels on blood tests.  Lack of appetite.  Weight loss.  Acid from the stomach coming up into the esophagus (gastroesophageal reflux).  Sudden tightening (spasm) of the stomach, which can be painful. Symptoms may come and go. Some people may not notice any symptoms. How is this diagnosed? This condition is diagnosed with tests, such as:  Tests that check how long it takes food to move through the stomach and intestines. These tests include: ? Upper gastrointestinal (GI) series. For this test, you drink a liquid that shows up well on X-rays, and then X-rays will be taken of your intestines. ? Gastric emptying scintigraphy. For this test, you eat food that contains a small amount of radioactive material, and then scans are taken. ? Wireless capsule GI monitoring system. For this test, you swallow a pill (capsule) that records information about how foods and fluid move through your stomach.  Gastric manometry. For this test, a tube is passed down your throat and into your stomach to measure electrical and muscular activity.  Endoscopy. For this test, a long, thin tube is passed down your throat and into your stomach to  check for problems in your stomach lining.  Ultrasound. This test uses sound waves to create images of inside the body. This can help rule out gallbladder disease or pancreatitis as a cause of your symptoms. How is this treated? There is no cure for gastroparesis. Treatment may include:   Treating the underlying cause.  Managing your symptoms by making changes to your diet and exercise habits.  Taking medicines to control nausea and vomiting and to stimulate stomach muscles.  Getting food through a feeding tube in the hospital. This may be done in severe cases.  Having surgery to insert a device into your body that helps improve stomach emptying and control nausea and vomiting (gastric neurostimulator). Follow these instructions at home:  Take over-the-counter and prescription medicines only as told by your health care provider.  Follow instructions from your health care provider about eating or drinking restrictions. Your health care provider may recommend that you: ? Eat smaller meals more often. ? Eat low-fat foods. ? Eat low-fiber forms of high-fiber foods. For example, eat cooked vegetables instead of raw vegetables. ? Have only liquid foods instead of solid foods. Liquid foods are easier to digest.  Drink enough fluid to keep your urine pale yellow.  Exercise as often as told by your health care provider.  Keep all follow-up visits as told by your health care provider. This is important. Contact a health care provider if you:  Notice that your symptoms do not improve with treatment.  Have new symptoms. Get help right away if you:  Have severe abdominal pain that does not improve with treatment.  Have nausea that is severe or does not go away.  Cannot drink fluids without vomiting. Summary  Gastroparesis is a chronic condition in which food takes longer than normal to empty from the stomach.  Symptoms include nausea, vomiting, heartburn, abdominal bloating, and loss of appetite.  Eating smaller portions, and low-fat, low-fiber foods may help you manage your symptoms.  Get help right away if you have severe abdominal pain. This information is not intended to replace advice given to you by your health care provider. Make sure you discuss any questions  you have with your health care provider. Document Released: 12/22/2004 Document Revised: 10/27/2016 Document Reviewed: 10/27/2016 Elsevier Interactive Patient Education  2019 [employer].

## 2018-03-25 DIAGNOSIS — E113293 Type 2 diabetes mellitus with mild nonproliferative diabetic retinopathy without macular edema, bilateral: Secondary | ICD-10-CM | POA: Diagnosis not present

## 2018-03-31 ENCOUNTER — Ambulatory Visit: Payer: Self-pay

## 2018-03-31 ENCOUNTER — Other Ambulatory Visit: Payer: Self-pay

## 2018-03-31 DIAGNOSIS — E1165 Type 2 diabetes mellitus with hyperglycemia: Secondary | ICD-10-CM | POA: Diagnosis not present

## 2018-03-31 DIAGNOSIS — IMO0002 Reserved for concepts with insufficient information to code with codable children: Secondary | ICD-10-CM

## 2018-03-31 DIAGNOSIS — Z6831 Body mass index (BMI) 31.0-31.9, adult: Principal | ICD-10-CM

## 2018-03-31 DIAGNOSIS — E1129 Type 2 diabetes mellitus with other diabetic kidney complication: Secondary | ICD-10-CM | POA: Diagnosis not present

## 2018-03-31 DIAGNOSIS — E781 Pure hyperglyceridemia: Secondary | ICD-10-CM | POA: Diagnosis not present

## 2018-03-31 DIAGNOSIS — E669 Obesity, unspecified: Secondary | ICD-10-CM | POA: Diagnosis not present

## 2018-03-31 DIAGNOSIS — E6609 Other obesity due to excess calories: Secondary | ICD-10-CM

## 2018-03-31 DIAGNOSIS — Z794 Long term (current) use of insulin: Secondary | ICD-10-CM | POA: Diagnosis not present

## 2018-03-31 DIAGNOSIS — E1121 Type 2 diabetes mellitus with diabetic nephropathy: Secondary | ICD-10-CM

## 2018-03-31 NOTE — Chronic Care Management (AMB) (Signed)
Care Management   Follow Up Note   03/31/2018 Name: SATORI KRABILL MRN: [id number] DOB: [birth year]  Referred by: Hubbard Hartshorn, FNP Reason for referral : Chronic Care Management (DM follow up)    Subjective: "I just got back from my endocrinologist and she was extremely pleased with how well I am doing"  Objective:  Lab Results  Component Value Date   HGBA1C 12.8 (A) 02/23/2018   Lab Results  Component Value Date   CREATININE 0.74 02/23/2018   BP Readings from Last 3 Encounters:  03/21/18 104/71  03/18/18 108/72  03/10/18 100/70    Assessment: Dolores Frame a 43year old femalewho seesEmily Uvaldo Rising, FNPfor primary care. Ms. Britt Boozer the CCM team to consult the patient fordiabetes education and management.Referral was placed2/19/2020 during patients last office visit. The CCM Team met with Ms. Nishida 02/28/2018 to establish health goals. Today, CCM RN CM followed up with Ms. Ailes to assess for questions, concerns, or needs related to DM self care and to assess progression towards goals. CCM RN CM also assessed for educations needs related to COVID-19 pandemic.  Review of patient status, including review of consultants reports, relevant laboratory and other test results, and collaboration with appropriate care team members and the patient's provider was performed as part of comprehensive patient evaluation and provision of chronic care management services.    Goals Addressed            This Visit's Progress   . COMPLETED: "I don't know much about my medicine" (pt-stated)       Current Barriers:  Marland Kitchen Knowledge Deficits related to medication use and indication  Pharmacist Clinical Goal(s):  Marland Kitchen Over the next 30 days, patient will demonstrate improved understanding of prescribed medications and rationale for usage as evidenced by good compliance and utilization of resources  Interventions: . Comprehensive medication review performed.  . Patient educated on  purpose, proper use and potential adverse effects of all medications.   .  Patient Self Care Activities:  . Self administers medications as prescribed . Attends all scheduled provider appointments . Calls provider office for new concerns or questions  Plan: . Patient will take all medications as prescribed . PharmD willfollow up in 30 days  *initial goal documentation       . COMPLETED: "I really need to get my blood sugar controlled" (pt-stated)       Ms. Mandrell states she is doing very well managing her diabetes. She is adhering to previously discussed ADA diet and exercise guidelines. She continues to use her Freestyle Libre and scans 6 times daily as instructed today by endocrinologist. She states her average blood glucose readings are 124. She continues to take ALL her medications as prescribed including   Current Barriers:  Marland Kitchen Knowledge Deficits related to understanding how to better manage DM through self care  Nurse Case Manager Clinical Goal(s):  Marland Kitchen Over the next 30 days, patient will verbalize basic understanding of DM disease process and self health management plan as evidenced by adhereing to ADA diet, checking blood sugars as prescribed utilizing [facility], and reporting medication adherence  Interventions:  . Discussed plans with patient for ongoing care management follow up and provided patient with direct contact information for care management team . Evaluation of current treatment plan related to DM self care and patient's adherence to plan as established by provider. . Provided patient with written and verbal educational materials related to Diabetes  . Reviewed Freestyle Libre with patient and assisted with  set up and sensor placement  Patient Self Care Activities:  . Self administers medications as prescribed . Attends all scheduled provider appointments . Calls provider office for new concerns or questions  . Check blood sugars independently and notify  Raelyn Ensign with sugars >400 and <70  Plan:  . Patient will take all medications as prescribed, check blood sugars BID and record, adhere to ADA diet   *initial goal documentation      Ms. APRILE DICKENSON has met all care management goals. Review of patient status, including review of consultants reports, relevant laboratory and other test results, and collaboration with appropriate care team members and the patient's provider was performed as part of comprehensive patient evaluation and provision of chronic care management services.  The care management team is available to Ms. Awilda Metro at any time to assist with care management needs should they arise. Ms. Pangborn has been given contact information and instructions to contact the care management team with any questions or should new care management needs arise.     Mayo Faulk E. Rollene Rotunda, RN, BSN Nurse Care Coordinator [facility] Care Management  [phone]

## 2018-03-31 NOTE — Patient Instructions (Addendum)
Thank you allowing the Chronic Care Management Team to be a part of your care! It was a pleasure speaking with you today!  Congratulations! You have met all case management goals! You may call the case management team at any time should you have a question or if you have new case management needs. We are happy to help you! We will let your doctor know that you have met your goals.    1. Continue to check your blood sugar and follow plan of care related to diabetes self care as discussed by your endocrinologist today. You are doing a great job! 2. Take all medications as prescribed 3. Follow CDC guidelines for COVID-19 infection prevention  Handwashing is one of the best ways to protect yourself and your family from getting sick. Wash Your Hands Often to Stay Healthy  When: You can help yourself and your loved ones stay healthy by washing your hands often, especially during these key times when you are likely to get and spread germs: . Before, during, and after preparing food  . Before eating food  . Before and after caring for someone at home who is sick with vomiting or diarrhea  . Before and after treating a cut or wound  . After using the toilet  . After being in public spaces . After changing diapers or cleaning up a child who has used the toilet  . After blowing your nose, coughing, or sneezing  . After touching an animal, animal feed, or animal waste  . After handling pet food or pet treats  . After touching garbage . After touching public doors, telephones, shopping carts, or other surfaces .  Five Steps to [place] the Right Way 1. Wet your hands with clean, running water (warm or cold), turn off the tap, and apply soap. 2. Lather your hands by rubbing them together with the soap. Lather the backs of your hands, between your fingers, and under your nails. 3. Scrub your hands for at least 20 seconds. Need a timer? Hum the "Happy Birthday" song from beginning to end  twice. 4. Rinse your hands well under clean, running water. 5. Dry your hands using a clean towel or air dry them.  Source: BridgeDigest.is  For information about COVID-19 or "Corona Virus", the following web resources may be helpful:  CDC: BeginnerSteps.be    [place]:  [email or web address]  CCM (Chronic Care Management) Team   Trish Fountain RN, BSN Nurse Care Coordinator  [phone]  Ruben Reason PharmD  Clinical Pharmacist  [phone]   [place], [place] Social Worker [phone]  Goals Addressed            This Visit's Progress   . COMPLETED: "I don't know much about my medicine" (pt-stated)       Current Barriers:  Marland Kitchen Knowledge Deficits related to medication use and indication  Pharmacist Clinical Goal(s):  Marland Kitchen Over the next 30 days, patient will demonstrate improved understanding of prescribed medications and rationale for usage as evidenced by good compliance and utilization of resources  Interventions: . Comprehensive medication review performed.  . Patient educated on purpose, proper use and potential adverse effects of all medications.   .  Patient Self Care Activities:  . Self administers medications as prescribed . Attends all scheduled provider appointments . Calls provider office for new concerns or questions  Plan: . Patient will take all medications as prescribed . PharmD willfollow up in 30 days  *initial goal documentation       .  COMPLETED: "I really need to get my blood sugar controlled" (pt-stated)       Current Barriers:  Marland Kitchen Knowledge Deficits related to understanding how to better manage DM through self care  Nurse Case Manager Clinical Goal(s):  Marland Kitchen Over the next 30 days, patient will verbalize basic understanding of DM disease process and self  health management plan as evidenced by adhereing to ADA diet, checking blood sugars as prescribed utilizing [facility], and reporting medication adherence  Interventions:  . Discussed plans with patient for ongoing care management follow up and provided patient with direct contact information for care management team . Evaluation of current treatment plan related to DM self care and patient's adherence to plan as established by provider. . Provided patient with written and verbal educational materials related to Diabetes  . Reviewed Freestyle Libre with patient and assisted with set up and sensor placement  Patient Self Care Activities:  . Self administers medications as prescribed . Attends all scheduled provider appointments . Calls provider office for new concerns or questions  . Check blood sugars independently and notify Raelyn Ensign with sugars >400 and <70  Plan:  . Patient will take all medications as prescribed, check blood sugars BID and record, adhere to ADA diet   *initial goal documentation              The patient verbalized understanding of instructions provided today and declined a print copy of patient instruction materials.   Next PCP appointment scheduled for: 06/09/2018 Next endocrinology appointment to be scheduled for "late May, early June" No further follow up required: patient has met CCM goals

## 2018-04-05 ENCOUNTER — Encounter: Payer: Self-pay | Admitting: Family Medicine

## 2018-04-26 ENCOUNTER — Ambulatory Visit: Payer: BLUE CROSS/BLUE SHIELD | Admitting: Gastroenterology

## 2018-05-26 ENCOUNTER — Encounter: Payer: Self-pay | Admitting: Family Medicine

## 2018-05-26 ENCOUNTER — Other Ambulatory Visit: Payer: Self-pay

## 2018-05-26 ENCOUNTER — Ambulatory Visit (INDEPENDENT_AMBULATORY_CARE_PROVIDER_SITE_OTHER): Payer: BLUE CROSS/BLUE SHIELD | Admitting: Family Medicine

## 2018-05-26 DIAGNOSIS — E1165 Type 2 diabetes mellitus with hyperglycemia: Secondary | ICD-10-CM | POA: Diagnosis not present

## 2018-05-26 DIAGNOSIS — R519 Headache, unspecified: Secondary | ICD-10-CM

## 2018-05-26 DIAGNOSIS — R51 Headache: Secondary | ICD-10-CM | POA: Diagnosis not present

## 2018-05-26 DIAGNOSIS — E1121 Type 2 diabetes mellitus with diabetic nephropathy: Secondary | ICD-10-CM | POA: Diagnosis not present

## 2018-05-26 DIAGNOSIS — IMO0002 Reserved for concepts with insufficient information to code with codable children: Secondary | ICD-10-CM

## 2018-05-26 MED ORDER — PROMETHAZINE HCL 12.5 MG PO TABS: 12.5000 mg | ORAL_TABLET | Freq: Three times a day (TID) | ORAL | 0 refills | Status: DC | PRN

## 2018-05-26 MED ORDER — PREDNISONE 20 MG PO TABS: 20.0000 mg | ORAL_TABLET | Freq: Two times a day (BID) | ORAL | 0 refills | Status: DC

## 2018-05-26 MED ORDER — BACLOFEN 10 MG PO TABS: 10.0000 mg | ORAL_TABLET | Freq: Three times a day (TID) | ORAL | 0 refills | Status: DC | PRN

## 2018-05-26 NOTE — Progress Notes (Signed)
Name: Laura GroomsBarbara F Mitchell   MRN: [id number]    DOB: Nov 03, 1975   Date:05/26/2018       Progress Note  Subjective  Chief Complaint  Chief Complaint  Patient presents with  . Headache    onset 3 days, pt states has not gone away at all. Blurry vision and some nausea    I connected with  Laura GroomsBarbara F Mitchell  on 05/26/18 at 11:00 AM EDT by a video enabled telemedicine application and verified that I am speaking with the correct person using two identifiers.  I discussed the limitations of evaluation and management by telemedicine and the availability of in person appointments. The patient expressed understanding and agreed to proceed. Staff also discussed with the patient that there may be a patient responsible charge related to this service. [facility] Provider Location: Office Additional Individuals present: None  HPI  Headache: For the past 3 days she has had a headache - today it is going from her entire head into the back of her neck.  She is not prone to headaches; she has had 1 migraine in her lifetime and this does not feel similar.  She rates her pain as 7/10 - she describes the pain as aching.  She is having blurred vision that is constant.  She left work today - uses a [employer] all day and could not tolerate. The pain does not prevent her from sleeping nor does is wake her from sleep.  Does endorse nausea yesterday.  Denies vomiting, confusion, no weakness/numbness/tingling, no slurred speech or facial droop.  Has Meloxicam at home but is not taking daily; has tried tylenol, ibuprofen, caffeine without relief.  She has not been able to check her blood pressure.   T2DM Uncontrolled: she did see endocrinology and has an appointment June 1. Her blood sugars have been higher in the last 2 weeks - has been 181, some have been above 200 over the last few days.  Last A1C was 12.8%.   Patient Active Problem List   Diagnosis Date Noted  . Uncontrolled type 2 diabetes mellitus with  microalbuminuric diabetic nephropathy (HCC) 02/25/2018  . CKD stage G1/A2, GFR > 90 and albumin creatinine ratio 30-299 mg/g 02/25/2018  . Class 1 obesity due to excess calories with serious comorbidity and body mass index (BMI) of 31.0 to 31.9 in adult 02/23/2018  . Pain of upper abdomen 02/23/2018  . Diarrhea 02/23/2018    Social History   Tobacco Use  . Smoking status: Never Smoker  . Smokeless tobacco: Never Used  Substance Use Topics  . Alcohol use: Yes    Comment: occasional    Current Outpatient Medications:  .  atorvastatin (LIPITOR) 20 MG tablet, Take 1 tablet (20 mg total) by mouth daily., Disp: 90 tablet, Rfl: 3 .  Insulin Degludec-Liraglutide (XULTOPHY) 100-3.6 UNIT-MG/ML SOPN, Inject 12-20 Units into the skin daily., Disp: 15 mL, Rfl: 1 .  meloxicam (MOBIC) 15 MG tablet, Take 0.5-1 tablets (7.5-15 mg total) by mouth daily., Disp: 30 tablet, Rfl: 0 .  Continuous Blood Gluc Sensor (FREESTYLE LIBRE 14 DAY SENSOR) MISC, 1 each by Does not apply route every 14 (fourteen) days., Disp: 6 each, Rfl: 3 .  dicyclomine (BENTYL) 10 MG capsule, Take 1 capsule (10 mg total) by mouth 3 (three) times daily as needed for spasms. (Patient not taking: Reported on 05/26/2018), Disp: 20 capsule, Rfl: 0 .  Insulin Pen Needle (NOVOFINE) 32G X 6 MM MISC, 1 each by Does not apply route  once a week., Disp: 100 each, Rfl: 0 .  lisinopril (PRINIVIL,ZESTRIL) 10 MG tablet, Take 1 tablet (10 mg total) by mouth daily. (Patient not taking: Reported on 05/26/2018), Disp: 90 tablet, Rfl: 3 .  omeprazole (PRILOSEC) 40 MG capsule, Take 1 capsule (40 mg total) by mouth 2 (two) times daily before a meal for 30 days., Disp: 60 capsule, Rfl: 2  Allergies  Allergen Reactions  . Metformin And Related Nausea And Vomiting    I personally reviewed active problem list, medication list, allergies, notes from last encounter, lab results with the patient/caregiver today.  ROS  Ten systems reviewed and is negative  except as mentioned in HPI.  Objective  Virtual encounter, vitals not obtained.  There is no height or weight on file to calculate BMI.  Nursing Note and Vital Signs reviewed.  Physical Exam  Constitutional: Patient appears well-developed and well-nourished. No distress.  HENT: Head: Normocephalic and atraumatic.  Neck: Normal range of motion. Pulmonary/Chest: Effort normal. No respiratory distress. Speaking in complete sentences Neurological: Pt is alert and oriented to person, place, and time. Coordination, speech and gait are normal. There is no facial droop.  Psychiatric: Patient has a normal mood and affect. behavior is normal. Judgment and thought content normal.  No results found for this or any previous visit (from the past 72 hour(s)).  Assessment & Plan 1. Acute nonintractable headache, unspecified headache type - I strongly recommend UC or ER evaluation due to report of blurred vision and severe headache with no real history of migraine or similar headache in the past.  She declines to go for further evaluation - I did explain risk of possible stroke or other abnormality that could be causing this headache and she verbalizes understanding.  She is well appearing on camera, though I am unable to perform most of the neurologic examination virtually.  We will proceed with outpatient care per her request. - Advised to take baclofen, prednisone and phenergan - did advise of potential sedative effect of phenergan and baclofen which she verbalizes understanding.  She will call back tomorrow if headache is not improving and I advised will likely recommend UC/ER or Stat CT head at that point.   - baclofen (LIORESAL) 10 MG tablet; Take 1 tablet (10 mg total) by mouth 3 (three) times daily as needed for muscle spasms.  Dispense: 10 each; Refill: 0 - predniSONE (DELTASONE) 20 MG tablet; Take 1 tablet (20 mg total) by mouth 2 (two) times daily with a meal.  Dispense: 4 tablet; Refill: 0 -  promethazine (PHENERGAN) 12.5 MG tablet; Take 1 tablet (12.5 mg total) by mouth every 8 (eight) hours as needed for nausea or vomiting.  Dispense: 10 tablet; Refill: 0   2. Uncontrolled type 2 diabetes mellitus with microalbuminuric diabetic nephropathy (HCC) - She is seeing endocrinology - I do not suspect that this is the cause of her headache at this time.  -Red flags and when to present for emergency care or RTC including fever >101.7F, chest pain, shortness of breath, new/worsening/un-resolving symptoms, stroke symptoms reviewed with patient at time of visit. Follow up and care instructions discussed and provided in AVS. - I discussed the assessment and treatment plan with the patient. The patient was provided an opportunity to ask questions and all were answered. The patient agreed with the plan and demonstrated an understanding of the instructions.  I provided 17 minutes of non-face-to-face time during this encounter.  Doren Custard, FNP

## 2018-06-10 ENCOUNTER — Encounter: Payer: BLUE CROSS/BLUE SHIELD | Admitting: Family Medicine

## 2018-06-10 DIAGNOSIS — Z794 Long term (current) use of insulin: Secondary | ICD-10-CM | POA: Diagnosis not present

## 2018-06-10 DIAGNOSIS — E1165 Type 2 diabetes mellitus with hyperglycemia: Secondary | ICD-10-CM | POA: Diagnosis not present

## 2018-06-17 DIAGNOSIS — E1165 Type 2 diabetes mellitus with hyperglycemia: Secondary | ICD-10-CM | POA: Diagnosis not present

## 2018-06-17 DIAGNOSIS — E781 Pure hyperglyceridemia: Secondary | ICD-10-CM | POA: Diagnosis not present

## 2018-06-17 DIAGNOSIS — E669 Obesity, unspecified: Secondary | ICD-10-CM | POA: Diagnosis not present

## 2018-06-17 DIAGNOSIS — E1129 Type 2 diabetes mellitus with other diabetic kidney complication: Secondary | ICD-10-CM | POA: Diagnosis not present

## 2018-10-06 DIAGNOSIS — E1129 Type 2 diabetes mellitus with other diabetic kidney complication: Secondary | ICD-10-CM | POA: Diagnosis not present

## 2018-10-06 DIAGNOSIS — E669 Obesity, unspecified: Secondary | ICD-10-CM | POA: Diagnosis not present

## 2018-10-06 DIAGNOSIS — Z794 Long term (current) use of insulin: Secondary | ICD-10-CM | POA: Diagnosis not present

## 2018-10-06 DIAGNOSIS — E781 Pure hyperglyceridemia: Secondary | ICD-10-CM | POA: Diagnosis not present

## 2018-10-06 DIAGNOSIS — E1165 Type 2 diabetes mellitus with hyperglycemia: Secondary | ICD-10-CM | POA: Diagnosis not present

## 2019-03-07 ENCOUNTER — Encounter: Payer: Self-pay | Admitting: Family Medicine

## 2019-03-07 ENCOUNTER — Ambulatory Visit: Payer: Self-pay

## 2019-03-07 NOTE — Telephone Encounter (Signed)
Pt c/o bilateral flank pain since 03/03/19. Py describes pain as an ache and rates as moderate. Pt stated pain is constant. Pt describes urine very dark yellow, with a "strong" smell. Pt stated that she is having a slight increase in frequency. No burning or fever or abdominal pain. Pt stated also having slight weakness to bilateral upper legs.  Care advice given.Call transferred to office for scheduling.  Reason for Disposition . Diabetes mellitus or weak immune system (e.g., HIV positive, cancer chemo, splenectomy, organ transplant, chronic steroids) (Exception: mild pain that is only present with movement)  Answer Assessment - Initial Assessment Questions 1. LOCATION: "Where does it hurt?" (e.g., left, right)     Bilateral flank pain 2. ONSET: "When did the pain start?"     03/03/19 3. SEVERITY: "How bad is the pain?" (e.g., Scale 1-10; mild, moderate, or severe)   - MILD (1-3): doesn't interfere with normal activities    - MODERATE (4-7): interferes with normal activities or awakens from sleep    - SEVERE (8-10): excruciating pain and patient unable to do normal activities (stays in bed)       moderate 4. PATTERN: "Does the pain come and go, or is it constant?"      constant 5. CAUSE: "What do you think is causing the pain?"     Kidney infection 6. OTHER SYMPTOMS:  "Do you have any other symptoms?" (e.g., fever, abdominal pain, vomiting, leg weakness, burning with urination, blood in urine)     Dark yellow urine, strong smelling,frequency has slightly increased, weak feeling bilateral upper legs, 7. PREGNANCY:  "Is there any chance you are pregnant?" "When was your last menstrual period?"     LMP: last week  Protocols used: FLANK PAIN-A-AH

## 2019-03-08 ENCOUNTER — Ambulatory Visit (INDEPENDENT_AMBULATORY_CARE_PROVIDER_SITE_OTHER): Payer: BC Managed Care – PPO | Admitting: Internal Medicine

## 2019-03-08 ENCOUNTER — Other Ambulatory Visit: Payer: Self-pay

## 2019-03-08 ENCOUNTER — Encounter: Payer: Self-pay | Admitting: Internal Medicine

## 2019-03-08 VITALS — BP 104/72 | HR 88 | Temp 97.3°F | Resp 16 | Ht 65.0 in | Wt 190.6 lb

## 2019-03-08 DIAGNOSIS — E1121 Type 2 diabetes mellitus with diabetic nephropathy: Secondary | ICD-10-CM

## 2019-03-08 DIAGNOSIS — R109 Unspecified abdominal pain: Secondary | ICD-10-CM

## 2019-03-08 DIAGNOSIS — E1165 Type 2 diabetes mellitus with hyperglycemia: Secondary | ICD-10-CM

## 2019-03-08 DIAGNOSIS — R82998 Other abnormal findings in urine: Secondary | ICD-10-CM | POA: Diagnosis not present

## 2019-03-08 DIAGNOSIS — R8290 Unspecified abnormal findings in urine: Secondary | ICD-10-CM

## 2019-03-08 DIAGNOSIS — IMO0002 Reserved for concepts with insufficient information to code with codable children: Secondary | ICD-10-CM

## 2019-03-08 LAB — POCT URINALYSIS DIPSTICK
Bilirubin, UA: NEGATIVE
Blood, UA: NEGATIVE
Glucose, UA: NEGATIVE
Ketones, UA: NEGATIVE
Leukocytes, UA: NEGATIVE
Nitrite, UA: NEGATIVE
Protein, UA: NEGATIVE
Spec Grav, UA: 1.015 (ref 1.010–1.025)
Urobilinogen, UA: 0.2 E.U./dL
pH, UA: 6 (ref 5.0–8.0)

## 2019-03-08 MED ORDER — CIPROFLOXACIN HCL 500 MG PO TABS: 500.0000 mg | ORAL_TABLET | Freq: Two times a day (BID) | ORAL | 0 refills | Status: AC

## 2019-03-08 NOTE — Progress Notes (Signed)
Patient ID: KYMBERLYN ECKFORD, female    DOB: [birth year], 44 y.o.   MRN: [id number]  PCP: Doren Custard, FNP  Chief Complaint  Patient presents with  . Back Pain    onset Friday 03/03/19  . Urine odor    dark urine    Subjective:   MARIANE BURPEE is a 44 y.o. female, presents to clinic with CC of the following:  Chief Complaint  Patient presents with  . Back Pain    onset Friday 03/03/19  . Urine odor    dark urine    HPI:  Patient is a 44 year old female with a history of diabetes who presents with back pain and a malodorous urine.  Her back pain started on Friday, and she also noted her urine was darker, and had a more foul smell.  Is continued through the weekend into early this week, with the back pain felt higher up than in the lower back.  Denies any blood in the urine.  Question some frequency.  No marked pain with urination.  No fevers, no nausea or vomiting. Notes no vaginal symptoms with no discharge or burning or itch. She wears a Dexcom, and her blood sugars run in the 160-200 range on average.  Not significantly increased in the recent past. Denies any other concerning symptoms.  Patient Active Problem List   Diagnosis Date Noted  . Uncontrolled type 2 diabetes mellitus with microalbuminuric diabetic nephropathy (HCC) 02/25/2018  . CKD stage G1/A2, GFR > 90 and albumin creatinine ratio 30-299 mg/g 02/25/2018  . Class 1 obesity due to excess calories with serious comorbidity and body mass index (BMI) of 31.0 to 31.9 in adult 02/23/2018  . Pain of upper abdomen 02/23/2018  . Diarrhea 02/23/2018      Current Outpatient Medications:  .  atorvastatin (LIPITOR) 20 MG tablet, Take 1 tablet (20 mg total) by mouth daily., Disp: 90 tablet, Rfl: 3 .  baclofen (LIORESAL) 10 MG tablet, Take 1 tablet (10 mg total) by mouth 3 (three) times daily as needed for muscle spasms., Disp: 10 each, Rfl: 0 .  Continuous Blood Gluc Sensor (FREESTYLE LIBRE 14 DAY SENSOR) MISC, 1  each by Does not apply route every 14 (fourteen) days., Disp: 6 each, Rfl: 3 .  Insulin Degludec-Liraglutide (XULTOPHY) 100-3.6 UNIT-MG/ML SOPN, Inject 12-20 Units into the skin daily., Disp: 15 mL, Rfl: 1 .  Insulin Pen Needle (NOVOFINE) 32G X 6 MM MISC, 1 each by Does not apply route once a week., Disp: 100 each, Rfl: 0 .  lisinopril (PRINIVIL,ZESTRIL) 10 MG tablet, Take 1 tablet (10 mg total) by mouth daily., Disp: 90 tablet, Rfl: 3 .  meloxicam (MOBIC) 15 MG tablet, Take 0.5-1 tablets (7.5-15 mg total) by mouth daily., Disp: 30 tablet, Rfl: 0 .  omeprazole (PRILOSEC) 40 MG capsule, Take 1 capsule (40 mg total) by mouth 2 (two) times daily before a meal for 30 days., Disp: 60 capsule, Rfl: 2 .  predniSONE (DELTASONE) 20 MG tablet, Take 1 tablet (20 mg total) by mouth 2 (two) times daily with a meal., Disp: 4 tablet, Rfl: 0 .  promethazine (PHENERGAN) 12.5 MG tablet, Take 1 tablet (12.5 mg total) by mouth every 8 (eight) hours as needed for nausea or vomiting., Disp: 10 tablet, Rfl: 0   Allergies  Allergen Reactions  . Metformin And Related Nausea And Vomiting     Past Surgical History:  Procedure Laterality Date  . CESAREAN SECTION    . CHOLECYSTECTOMY  Family History  Problem Relation Age of Onset  . Diabetes Mother   . Kidney disease Mother   . Heart disease Mother   . Diabetes Father   . Arthritis Father   . Heart disease Maternal Grandmother   . Heart disease Maternal Grandfather   . Alzheimer's disease Paternal Grandmother      Social History   Tobacco Use  . Smoking status: Never Smoker  . Smokeless tobacco: Never Used  Substance Use Topics  . Alcohol use: Yes    Comment: occasional    With staff assistance, above reviewed with the patient today.  ROS: As per HPI, otherwise no specific complaints on a limited and focused system review   No results found for this or any previous visit (from the past 72 hour(s)).   PHQ2/9: Depression screen [facility] 2/9  03/08/2019 05/26/2018 03/18/2018 03/10/2018 02/23/2018  Decreased Interest 0 0 0 0 0  Down, Depressed, Hopeless 0 0 0 0 0  PHQ - 2 Score 0 0 0 0 0  Altered sleeping 0 0 0 0 0  Tired, decreased energy 0 0 0 0 0  Change in appetite 0 0 0 0 0  Feeling bad or failure about yourself  0 0 0 0 0  Trouble concentrating 0 0 0 0 0  Moving slowly or fidgety/restless 0 0 0 0 0  Suicidal thoughts 0 0 0 0 0  PHQ-9 Score 0 0 0 0 0  Difficult doing work/chores Not difficult at all Not difficult at all Not difficult at all - Not difficult at all   PHQ-2/9 Result is neg  Fall Risk: Fall Risk  03/08/2019 05/26/2018 03/18/2018 03/10/2018 02/23/2018  Falls in the past year? 0 0 0 0 0  Number falls in past yr: 0 0 - 0 0  Injury with Fall? 0 0 - 0 0  Follow up - - - - Falls evaluation completed      Objective:   Vitals:   03/08/19 0737  BP: 104/72  Pulse: 88  Resp: 16  Temp: (!) 97.3 F (36.3 C)  TempSrc: Temporal  SpO2: 99%  Weight: 190 lb 9.6 oz (86.5 kg)  Height: 5\' 5"  (1.651 m)    Body mass index is 31.72 kg/m.  Physical Exam   NAD, masked, pleasant HEENT - [place]/AT, sclera anicteric, Neck - supple, no adenopathy, Car - RRR without m/g/r Pulm- RR and effort normal at rest, CTA without wheeze or rales Abd - soft, NT, ND, BS+,  no masses, mild suprapubic tenderness to palpation, not marked. Back - + left  CVA tenderness, no right CVA tenderness.  Nontender in the lower back muscles, nontender over the lumbosacral spine. Skin- no rash noted on exposed areas,  Neuro/psychiatric - affect was not flat, appropriate with conversation  Alert and oriented  Speech and gait are normal   Urine dip in office - foul odor, negative otherwise.   Results for orders placed or performed in visit on 02/23/18  COMPLETE METABOLIC PANEL WITH GFR  Result Value Ref Range   Glucose, Bld 338 (H) 65 - 99 mg/dL   BUN 15 7 - 25 mg/dL   Creat 0.74 0.50 - 1.10 mg/dL   GFR, Est Non African American 101 > OR = 60  mL/min/1.82m2   GFR, Est African American 117 > OR = 60 mL/min/1.39m2   BUN/Creatinine Ratio NOT APPLICABLE 6 - 22 (calc)   Sodium 134 (L) 135 - 146 mmol/L   Potassium 4.4 3.5 - 5.3 mmol/L  Chloride 99 98 - 110 mmol/L   CO2 27 20 - 32 mmol/L   Calcium 9.4 8.6 - 10.2 mg/dL   Total Protein 7.4 6.1 - 8.1 g/dL   Albumin 4.5 3.6 - 5.1 g/dL   Globulin 2.9 1.9 - 3.7 g/dL (calc)   AG Ratio 1.6 1.0 - 2.5 (calc)   Total Bilirubin 0.4 0.2 - 1.2 mg/dL   Alkaline phosphatase (APISO) 60 31 - 125 U/L   AST 13 10 - 30 U/L   ALT 18 6 - 29 U/L  Urine Microalbumin w/creat. ratio  Result Value Ref Range   Creatinine, Urine 58 20 - 275 mg/dL   Microalb, Ur 6.9 mg/dL   Microalb Creat Ratio 119 (H) <30 mcg/mg creat  Lipid panel  Result Value Ref Range   Cholesterol 167 <200 mg/dL   HDL 51 > OR = 50 mg/dL   Triglycerides 474 (H) <150 mg/dL   LDL Cholesterol (Calc) 87 mg/dL (calc)   Total CHOL/HDL Ratio 3.3 <5.0 (calc)   Non-HDL Cholesterol (Calc) 116 <130 mg/dL (calc)  H. pylori breath test  Result Value Ref Range   H. pylori Breath Test NOT DETECTED NOT DETECT  Lipase  Result Value Ref Range   Lipase 27 7 - 60 U/L  CBC w/Diff/Platelet  Result Value Ref Range   WBC 6.3 3.8 - 10.8 Thousand/uL   RBC 4.72 3.80 - 5.10 Million/uL   Hemoglobin 14.4 11.7 - 15.5 g/dL   HCT 25.9 56.3 - 87.5 %   MCV 89.0 80.0 - 100.0 fL   MCH 30.5 27.0 - 33.0 pg   MCHC 34.3 32.0 - 36.0 g/dL   RDW 64.3 32.9 - 51.8 %   Platelets 235 140 - 400 Thousand/uL   MPV 11.6 7.5 - 12.5 fL   Neutro Abs 3,478 1,500 - 7,800 cells/uL   Lymphs Abs 2,432 850 - 3,900 cells/uL   Absolute Monocytes 302 200 - 950 cells/uL   Eosinophils Absolute 50 15 - 500 cells/uL   Basophils Absolute 38 0 - 200 cells/uL   Neutrophils Relative % 55.2 %   Total Lymphocyte 38.6 %   Monocytes Relative 4.8 %   Eosinophils Relative 0.8 %   Basophils Relative 0.6 %  Hepatitis C antibody  Result Value Ref Range   Hepatitis C Ab NON-REACTIVE NON-REACTI    SIGNAL TO CUT-OFF 0.01 <1.00  HIV Antibody (routine testing w rflx)  Result Value Ref Range   HIV 1&2 Ab, 4th Generation NON-REACTIVE NON-REACTI  POCT glycosylated hemoglobin (Hb A1C)  Result Value Ref Range   Hemoglobin A1C     HbA1c POC (<> result, manual entry)     HbA1c, POC (prediabetic range)     HbA1c, POC (controlled diabetic range) 12.8 (A) 0.0 - 7.0 %       Assessment & Plan:      1. Abnormal urine odor  - POCT Urinalysis Dipstick - ciprofloxacin (CIPRO) 500 MG tablet; Take 1 tablet (500 mg total) by mouth 2 (two) times daily for 5 days.  Dispense: 10 tablet; Refill: 0  2. Acute left flank pain  - ciprofloxacin (CIPRO) 500 MG tablet; Take 1 tablet (500 mg total) by mouth 2 (two) times daily for 5 days.  Dispense: 10 tablet; Refill: 0  3. Dark urine  - ciprofloxacin (CIPRO) 500 MG tablet; Take 1 tablet (500 mg total) by mouth 2 (two) times daily for 5 days.  Dispense: 10 tablet; Refill: 0  Do feel empiric treatment is indicated, with concerns for  possible early pyelonephritis.  Her urine dip in the office today did not show significant concerns for infection, did have foul odor noted.  We will send for a urinalysis and culture and sensitivity and treat empirically with a Cipro product twice daily for 5 days. Also recommended staying well-hydrated. If symptoms not improving or worsening as progressing through the weekend, should follow-up. Also emphasized if develops higher fevers, nausea, vomiting, or feeling more ill, should be seen more urgently in a more emergent setting like an emergency room and she was understanding of that.  4. DM - not well controlled noted in past  Jamelle Haring, MD 03/08/19 7:52 AM

## 2019-03-09 LAB — URINALYSIS, COMPLETE
Bilirubin Urine: NEGATIVE
Glucose, UA: NEGATIVE
Hgb urine dipstick: NEGATIVE
Hyaline Cast: NONE SEEN /LPF
Leukocytes,Ua: NEGATIVE
Nitrite: NEGATIVE
Protein, ur: NEGATIVE
Specific Gravity, Urine: 1.022 (ref 1.001–1.03)
WBC, UA: NONE SEEN /HPF (ref 0–5)
pH: 5.5 (ref 5.0–8.0)

## 2019-03-09 LAB — URINE CULTURE
MICRO NUMBER:: 10210840
Result:: NO GROWTH
SPECIMEN QUALITY:: ADEQUATE

## 2019-04-21 DIAGNOSIS — E1129 Type 2 diabetes mellitus with other diabetic kidney complication: Secondary | ICD-10-CM | POA: Diagnosis not present

## 2019-04-21 DIAGNOSIS — E781 Pure hyperglyceridemia: Secondary | ICD-10-CM | POA: Diagnosis not present

## 2019-04-21 DIAGNOSIS — E669 Obesity, unspecified: Secondary | ICD-10-CM | POA: Diagnosis not present

## 2019-04-21 DIAGNOSIS — E1165 Type 2 diabetes mellitus with hyperglycemia: Secondary | ICD-10-CM | POA: Diagnosis not present

## 2019-04-21 DIAGNOSIS — Z794 Long term (current) use of insulin: Secondary | ICD-10-CM | POA: Diagnosis not present

## 2019-07-05 ENCOUNTER — Other Ambulatory Visit: Payer: Self-pay

## 2019-07-05 DIAGNOSIS — N181 Chronic kidney disease, stage 1: Secondary | ICD-10-CM

## 2019-07-05 DIAGNOSIS — E7800 Pure hypercholesterolemia, unspecified: Secondary | ICD-10-CM

## 2019-07-05 DIAGNOSIS — IMO0002 Reserved for concepts with insufficient information to code with codable children: Secondary | ICD-10-CM

## 2019-07-05 MED ORDER — LISINOPRIL 10 MG PO TABS: 10.0000 mg | ORAL_TABLET | Freq: Every day | ORAL | 3 refills | Status: AC

## 2019-07-05 MED ORDER — ATORVASTATIN CALCIUM 20 MG PO TABS: 20.0000 mg | ORAL_TABLET | Freq: Every day | ORAL | 3 refills | Status: AC

## 2019-07-17 DIAGNOSIS — J019 Acute sinusitis, unspecified: Secondary | ICD-10-CM | POA: Diagnosis not present

## 2019-07-17 DIAGNOSIS — J029 Acute pharyngitis, unspecified: Secondary | ICD-10-CM | POA: Diagnosis not present

## 2019-07-17 DIAGNOSIS — Z03818 Encounter for observation for suspected exposure to other biological agents ruled out: Secondary | ICD-10-CM | POA: Diagnosis not present

## 2019-07-26 NOTE — Progress Notes (Signed)
Patient is a 44 year old female patient of Laura Mitchell I saw the patient on 03/08/2019 for urinary symptoms.  That note was reviewed.  She is a diabetic, not well controlled and noted on that visit, and follows with endocrine. He follows up today with concerns for persistent cough, with the visit a telephone visit.  She was seen in urgent care 07/17/2019 with the following history:  Laura Mitchell is a 44 y.o. female here for Covid testing. She reports a history of sore throat over the last 2 weeks, associated with a nonproductive cough. She also reports nasal congestion. She denies any seasonal allergies. She reports the cough is worse at night. She also reports rhinorrhea and sneezing at night. She is concerned because she keeps, lives with her grandchildren, the oldest of which was recently diagnosed with RSV. The 12-month-old was diagnosed with Covid recently. She has not been vaccinated against Covid. She denies any fever or chills. Her work would not permit her to come back to work without a negative Covid test. She denies any history of seasonal sinus infections. The assessment/plan from that visit was as follows:  Other orders - cefdinir (OMNICEF) 300 mg capsule; Take 1 capsule (300 mg total) by mouth 2 (two) times daily for 7 days Patient Instructions  As we discussed, you shall remain in quarantine, until final test results are reviewed. If negative, you may return to normal activity, practicing social distancing, continue to exercise proper hygiene and frequent handwashing. If positive, you shall remain in quarantine for 10 days from the start of symptoms. You may return to normal activity after that time, if you remain without a fever for at least 24 hours, again practicing social distancing, exercising proper hygiene, and frequent handwashing.  Covid test returned negative.

## 2019-07-27 ENCOUNTER — Telehealth: Payer: BC Managed Care – PPO | Admitting: Internal Medicine

## 2019-07-27 ENCOUNTER — Telehealth: Payer: Self-pay

## 2019-07-27 DIAGNOSIS — E1165 Type 2 diabetes mellitus with hyperglycemia: Secondary | ICD-10-CM | POA: Diagnosis not present

## 2019-07-27 DIAGNOSIS — Z9109 Other allergy status, other than to drugs and biological substances: Secondary | ICD-10-CM | POA: Diagnosis not present

## 2019-07-27 DIAGNOSIS — H6692 Otitis media, unspecified, left ear: Secondary | ICD-10-CM | POA: Diagnosis not present

## 2019-07-27 DIAGNOSIS — E669 Obesity, unspecified: Secondary | ICD-10-CM | POA: Diagnosis not present

## 2019-07-27 NOTE — Telephone Encounter (Signed)
I called the patient at 7:[phone]) to triage her for her 0800 telephone visit with Dr. Dorris Fetch and there was no answer. I attempted to reach the patient by phone again at 7:50 and there was no answer, I left a voicemail. Third attempt to reach patient by phone at 7:57am, no answer, went to VM.   4th attempt to contact patient by phone @ 0805, no answer.

## 2019-08-25 DIAGNOSIS — U071 COVID-19: Secondary | ICD-10-CM | POA: Diagnosis not present

## 2019-08-25 DIAGNOSIS — Z20822 Contact with and (suspected) exposure to covid-19: Secondary | ICD-10-CM | POA: Diagnosis not present

## 2019-09-26 ENCOUNTER — Emergency Department
Admission: EM | Admit: 2019-09-26 | Discharge: 2019-09-26 | Disposition: A | Discharge status: Home / Self Care | Payer: BC Managed Care – PPO | Attending: Emergency Medicine | Admitting: Emergency Medicine

## 2019-09-26 ENCOUNTER — Other Ambulatory Visit: Payer: Self-pay

## 2019-09-26 DIAGNOSIS — R509 Fever, unspecified: Secondary | ICD-10-CM | POA: Diagnosis not present

## 2019-09-26 DIAGNOSIS — J111 Influenza due to unidentified influenza virus with other respiratory manifestations: Secondary | ICD-10-CM

## 2019-09-26 DIAGNOSIS — E1122 Type 2 diabetes mellitus with diabetic chronic kidney disease: Secondary | ICD-10-CM | POA: Diagnosis not present

## 2019-09-26 DIAGNOSIS — N189 Chronic kidney disease, unspecified: Secondary | ICD-10-CM | POA: Insufficient documentation

## 2019-09-26 DIAGNOSIS — Z794 Long term (current) use of insulin: Secondary | ICD-10-CM | POA: Insufficient documentation

## 2019-09-26 DIAGNOSIS — Z79899 Other long term (current) drug therapy: Secondary | ICD-10-CM | POA: Diagnosis not present

## 2019-09-26 DIAGNOSIS — E1140 Type 2 diabetes mellitus with diabetic neuropathy, unspecified: Secondary | ICD-10-CM | POA: Insufficient documentation

## 2019-09-26 DIAGNOSIS — E860 Dehydration: Secondary | ICD-10-CM | POA: Insufficient documentation

## 2019-09-26 LAB — CBC WITH DIFFERENTIAL/PLATELET
Abs Immature Granulocytes: 0.02 10*3/uL (ref 0.00–0.07)
Basophils Absolute: 0 10*3/uL (ref 0.0–0.1)
Basophils Relative: 0 %
Eosinophils Absolute: 0 10*3/uL (ref 0.0–0.5)
Eosinophils Relative: 0 %
HCT: 36.7 % (ref 36.0–46.0)
Hemoglobin: 12.5 g/dL (ref 12.0–15.0)
Immature Granulocytes: 0 %
Lymphocytes Relative: 7 %
Lymphs Abs: 0.6 10*3/uL — ABNORMAL LOW (ref 0.7–4.0)
MCH: 30.5 pg (ref 26.0–34.0)
MCHC: 34.1 g/dL (ref 30.0–36.0)
MCV: 89.5 fL (ref 80.0–100.0)
Monocytes Absolute: 0.3 10*3/uL (ref 0.1–1.0)
Monocytes Relative: 4 %
Neutro Abs: 7.4 10*3/uL (ref 1.7–7.7)
Neutrophils Relative %: 89 %
Platelets: 184 10*3/uL (ref 150–400)
RBC: 4.1 MIL/uL (ref 3.87–5.11)
RDW: 12.6 % (ref 11.5–15.5)
WBC: 8.4 10*3/uL (ref 4.0–10.5)
nRBC: 0 % (ref 0.0–0.2)

## 2019-09-26 LAB — URINALYSIS, COMPLETE (UACMP) WITH MICROSCOPIC
Bilirubin Urine: NEGATIVE
Glucose, UA: NEGATIVE mg/dL
Ketones, ur: NEGATIVE mg/dL
Nitrite: NEGATIVE
Protein, ur: NEGATIVE mg/dL
Specific Gravity, Urine: 1.026 (ref 1.005–1.030)
pH: 5 (ref 5.0–8.0)

## 2019-09-26 LAB — COMPREHENSIVE METABOLIC PANEL
ALT: 21 U/L (ref 0–44)
AST: 18 U/L (ref 15–41)
Albumin: 4 g/dL (ref 3.5–5.0)
Alkaline Phosphatase: 49 U/L (ref 38–126)
Anion gap: 9 (ref 5–15)
BUN: 14 mg/dL (ref 6–20)
CO2: 25 mmol/L (ref 22–32)
Calcium: 8.4 mg/dL — ABNORMAL LOW (ref 8.9–10.3)
Chloride: 101 mmol/L (ref 98–111)
Creatinine, Ser: 0.95 mg/dL (ref 0.44–1.00)
GFR calc Af Amer: >60 mL/min (ref >60)
GFR calc non Af Amer: >60 mL/min (ref >60)
Glucose, Bld: 211 mg/dL — ABNORMAL HIGH (ref 70–99)
Potassium: 4.1 mmol/L (ref 3.5–5.1)
Sodium: 135 mmol/L (ref 135–145)
Total Bilirubin: 1 mg/dL (ref 0.3–1.2)
Total Protein: 7.5 g/dL (ref 6.5–8.1)

## 2019-09-26 LAB — POCT PREGNANCY, URINE: Preg Test, Ur: NEGATIVE

## 2019-09-26 MED ORDER — NAPROXEN 500 MG PO TABS: 500.0000 mg | ORAL_TABLET | Freq: Two times a day (BID) | ORAL | 0 refills | Status: DC

## 2019-09-26 MED ORDER — FAMOTIDINE 20 MG PO TABS: 20.0000 mg | ORAL_TABLET | Freq: Two times a day (BID) | ORAL | 0 refills | Status: DC

## 2019-09-26 MED ORDER — ONDANSETRON 4 MG PO TBDP: 4.0000 mg | ORAL_TABLET | Freq: Three times a day (TID) | ORAL | 0 refills | Status: DC | PRN

## 2019-09-26 MED ORDER — FAMOTIDINE 20 MG PO TABS
40.0000 mg | ORAL_TABLET | Freq: Once | ORAL | Status: AC
  Administered 2019-09-26: 40 mg via ORAL
  Filled 2019-09-26: qty 2

## 2019-09-26 MED ORDER — ONDANSETRON 4 MG PO TBDP
8.0000 mg | ORAL_TABLET | Freq: Once | ORAL | Status: AC
  Administered 2019-09-26: 8 mg via ORAL
  Filled 2019-09-26: qty 2

## 2019-09-26 NOTE — ED Notes (Signed)
Patient verbalizes understanding of discharge instructions. Opportunity for questioning and answers were provided. Armband removed by staff, pt discharged from ED. Ambulated out to lobby  

## 2019-09-26 NOTE — ED Triage Notes (Signed)
Pt states she woke up in the middle of the night with BL LE pain/aching, fever, nausea.. states she had covid 3 weeks ago. Pt is in NAD at present.

## 2019-09-26 NOTE — ED Provider Notes (Signed)
[facility] Provider Note  ____________________________________________  Time seen: Approximately 1:06 PM  I have reviewed the triage vital signs and the nursing notes.   HISTORY  Chief Complaint Generalized Body Aches and Fever    HPI Laura Mitchell is a 44 y.o. female with a history of diabetes who reports being in her usual state of health until last night when she developed chills, diffuse body aches, nausea.  Symptoms are constant, no aggravating or alleviating factors, no radiating pain or focal pain complaints.  Symptoms are rated as moderate.  About 4 weeks ago she and ever else in her house got sick with Covid.  She reports that she was sick for 2 weeks, and then recovered and has been feeling well for the past 2 weeks, back to work and usual activities.    She ate chicken tacos for dinner last night as it everyone else, and there was nothing unusual about the food.      Past Medical History:  Diagnosis Date  . Diabetes mellitus without complication [facility])      Patient Active Problem List   Diagnosis Date Noted  . Uncontrolled type 2 diabetes mellitus with microalbuminuric diabetic nephropathy (HCC) 02/25/2018  . CKD stage G1/A2, GFR > 90 and albumin creatinine ratio 30-299 mg/g 02/25/2018  . Class 1 obesity due to excess calories with serious comorbidity and body mass index (BMI) of 31.0 to 31.9 in adult 02/23/2018  . Pain of upper abdomen 02/23/2018  . Diarrhea 02/23/2018     Past Surgical History:  Procedure Laterality Date  . CESAREAN SECTION    . CHOLECYSTECTOMY       Prior to Admission medications   Medication Sig Start Date End Date Taking? Authorizing Provider  atorvastatin (LIPITOR) 20 MG tablet Take 1 tablet (20 mg total) by mouth daily. 07/05/19   Jamelle Haring, MD  baclofen (LIORESAL) 10 MG tablet Take 1 tablet (10 mg total) by mouth 3 (three) times daily as needed for muscle spasms. 05/26/18    Doren Custard, FNP  Continuous Blood Gluc Sensor (FREESTYLE LIBRE 14 DAY SENSOR) MISC 1 each by Does not apply route every 14 (fourteen) days. 03/18/18   Doren Custard, FNP  famotidine (PEPCID) 20 MG tablet Take 1 tablet (20 mg total) by mouth 2 (two) times daily. 09/26/19   Sharman Cheek, MD  Insulin Degludec-Liraglutide (XULTOPHY) 100-3.6 UNIT-MG/ML SOPN Inject 12-20 Units into the skin daily. 02/23/18   Doren Custard, FNP  Insulin Pen Needle (NOVOFINE) 32G X 6 MM MISC 1 each by Does not apply route once a week. 02/23/18   Doren Custard, FNP  lisinopril (ZESTRIL) 10 MG tablet Take 1 tablet (10 mg total) by mouth daily. 07/05/19   Jamelle Haring, MD  meloxicam (MOBIC) 15 MG tablet Take 0.5-1 tablets (7.5-15 mg total) by mouth daily. 03/18/18   Doren Custard, FNP  naproxen (NAPROSYN) 500 MG tablet Take 1 tablet (500 mg total) by mouth 2 (two) times daily with a meal. 09/26/19   Sharman Cheek, MD  omeprazole (PRILOSEC) 40 MG capsule Take 1 capsule (40 mg total) by mouth 2 (two) times daily before a meal for 30 days. 03/21/18 04/20/18  Toney Reil, MD  ondansetron (ZOFRAN ODT) 4 MG disintegrating tablet Take 1 tablet (4 mg total) by mouth every 8 (eight) hours as needed for nausea or vomiting. 09/26/19   Sharman Cheek, MD  predniSONE (DELTASONE) 20 MG tablet Take 1 tablet (20  mg total) by mouth 2 (two) times daily with a meal. 05/26/18   Doren Custard, FNP  promethazine (PHENERGAN) 12.5 MG tablet Take 1 tablet (12.5 mg total) by mouth every 8 (eight) hours as needed for nausea or vomiting. 05/26/18   Doren Custard, FNP     Allergies Metformin and related   Family History  Problem Relation Age of Onset  . Diabetes Mother   . Kidney disease Mother   . Heart disease Mother   . Diabetes Father   . Arthritis Father   . Heart disease Maternal Grandmother   . Heart disease Maternal Grandfather   . Alzheimer's disease Paternal Grandmother     Social History Social  History   Tobacco Use  . Smoking status: Never Smoker  . Smokeless tobacco: Never Used  Vaping Use  . Vaping Use: Never used  Substance Use Topics  . Alcohol use: Yes    Comment: occasional  . Drug use: Never    Review of Systems  Constitutional: No fever, positive chills.  ENT:   No sore throat. No rhinorrhea. Cardiovascular:   No chest pain or syncope. Respiratory:   No dyspnea or cough. Gastrointestinal:   Negative for abdominal pain, vomiting and diarrhea.  Musculoskeletal:   Negative for focal pain or swelling All other systems reviewed and are negative except as documented above in ROS and HPI.  ____________________________________________   PHYSICAL EXAM:  VITAL SIGNS: ED Triage Vitals  Enc Vitals Group     BP 09/26/19 0832 112/67     Pulse Rate 09/26/19 0832 (!) 115     Resp 09/26/19 0832 17     Temp 09/26/19 0832 99.3 F (37.4 C)     Temp Source 09/26/19 0832 Oral     SpO2 09/26/19 0832 100 %     Weight 09/26/19 0833 185 lb (83.9 kg)     Height 09/26/19 0833 5\' 2"  (1.575 m)     Head Circumference --      Peak Flow --      Pain Score 09/26/19 0833 7     Pain Loc --      Pain Edu? --      Excl. in GC? --     Vital signs reviewed, nursing assessments reviewed.   Constitutional:   Alert and oriented. Non-toxic appearance. Eyes:   Conjunctivae are normal. EOMI. PERRL. ENT      Head:   Normocephalic and atraumatic.      Nose:   Wearing a mask.      Mouth/Throat:   Wearing a mask.      Neck:   No meningismus. Full ROM. Hematological/Lymphatic/Immunilogical:   No cervical lymphadenopathy. Cardiovascular:   RRR. Symmetric bilateral radial and DP pulses.  No murmurs. Cap refill less than 2 seconds. Respiratory:   Normal respiratory effort without tachypnea/retractions. Breath sounds are clear and equal bilaterally. No wheezes/rales/rhonchi. Gastrointestinal:   Soft and nontender. Non distended. There is no CVA tenderness.  No rebound, rigidity, or  guarding.  Musculoskeletal:   Normal range of motion in all extremities. No joint effusions.  No lower extremity tenderness.  No edema. Neurologic:   Normal speech and language.  Motor grossly intact. No acute focal neurologic deficits are appreciated.  Skin:    Skin is warm, dry and intact. No rash noted.  No petechiae, purpura, or bullae.  ____________________________________________    LABS (pertinent positives/negatives) (all labs ordered are listed, but only abnormal results are displayed) Labs Reviewed  COMPREHENSIVE METABOLIC  PANEL - Abnormal; Notable for the following components:      Result Value   Glucose, Bld 211 (*)    Calcium 8.4 (*)    All other components within normal limits  CBC WITH DIFFERENTIAL/PLATELET - Abnormal; Notable for the following components:   Lymphs Abs 0.6 (*)    All other components within normal limits  URINALYSIS, COMPLETE (UACMP) WITH MICROSCOPIC - Abnormal; Notable for the following components:   Color, Urine YELLOW (*)    APPearance CLOUDY (*)    Hgb urine dipstick MODERATE (*)    Leukocytes,Ua TRACE (*)    Bacteria, UA RARE (*)    All other components within normal limits  POCT PREGNANCY, URINE  POC URINE PREG, ED   ____________________________________________   EKG    ____________________________________________    RADIOLOGY  No results found.  ____________________________________________   PROCEDURES Procedures  ____________________________________________    CLINICAL IMPRESSION / ASSESSMENT AND PLAN / ED COURSE  Medications ordered in the ED: Medications  ondansetron (ZOFRAN-ODT) disintegrating tablet 8 mg (8 mg Oral Given 09/26/19 1200)  famotidine (PEPCID) tablet 40 mg (40 mg Oral Given 09/26/19 1200)    Pertinent labs & imaging results that were available during my care of the patient were reviewed by me and considered in my medical decision making (see chart for details).  Laura Mitchell was evaluated in  Emergency Department on 09/26/2019 for the symptoms described in the history of present illness. She was evaluated in the context of the global COVID-19 pandemic, which necessitated consideration that the patient might be at risk for infection with the SARS-CoV-2 virus that causes COVID-19. Institutional protocols and algorithms that pertain to the evaluation of patients at risk for COVID-19 are in a state of rapid change based on information released by regulatory bodies including the CDC and federal and state organizations. These policies and algorithms were followed during the patient's care in the ED.   Patient presents with multiple constitutional symptoms consistent with influenza-like illness/viral syndrome.  She appears mildly dehydrated.  In triage she had tachycardia of 115, but on my exam her heart rate is 90 and she is nontoxic, not septic.  Exam is reassuring.  Labs are unremarkable, normal urinalysis, normal chemistry panel except for slight hyperglycemia attributable to her underlying diabetes.    ----------------------------------------- 1:10 PM on 09/26/2019 -----------------------------------------  Feeling better after oral Zofran and Pepcid, tolerating oral intake.  Will discharge home on continued supportive care and oral meds.      ____________________________________________   FINAL CLINICAL IMPRESSION(S) / ED DIAGNOSES    Final diagnoses:  Influenza-like illness  Dehydration     ED Discharge Orders         Ordered    ondansetron (ZOFRAN ODT) 4 MG disintegrating tablet  Every 8 hours PRN        09/26/19 1305    famotidine (PEPCID) 20 MG tablet  2 times daily        09/26/19 1305    naproxen (NAPROSYN) 500 MG tablet  2 times daily with meals        09/26/19 1305          Portions of this note were generated with dragon dictation software. Dictation errors may occur despite best attempts at proofreading.   Sharman Cheek, MD 09/26/19 1310

## 2019-10-03 ENCOUNTER — Other Ambulatory Visit: Payer: Self-pay | Admitting: Family Medicine

## 2019-10-03 DIAGNOSIS — R1031 Right lower quadrant pain: Secondary | ICD-10-CM

## 2019-10-03 DIAGNOSIS — R110 Nausea: Secondary | ICD-10-CM | POA: Diagnosis not present

## 2019-10-03 DIAGNOSIS — M25512 Pain in left shoulder: Secondary | ICD-10-CM | POA: Diagnosis not present

## 2019-10-03 DIAGNOSIS — M545 Low back pain: Secondary | ICD-10-CM | POA: Diagnosis not present

## 2019-10-04 ENCOUNTER — Ambulatory Visit
Admission: RE | Admit: 2019-10-04 | Discharge: 2019-10-04 | Disposition: A | Discharge status: Home / Self Care | Payer: BC Managed Care – PPO | Source: Ambulatory Visit | Attending: Family Medicine | Admitting: Family Medicine

## 2019-10-04 ENCOUNTER — Other Ambulatory Visit: Payer: Self-pay

## 2019-10-04 DIAGNOSIS — R109 Unspecified abdominal pain: Secondary | ICD-10-CM | POA: Diagnosis not present

## 2019-10-04 DIAGNOSIS — R1031 Right lower quadrant pain: Secondary | ICD-10-CM | POA: Insufficient documentation

## 2019-10-04 DIAGNOSIS — R110 Nausea: Secondary | ICD-10-CM | POA: Insufficient documentation

## 2019-10-04 MED ORDER — IOHEXOL 300 MG/ML  SOLN
100.0000 mL | Freq: Once | INTRAMUSCULAR | Status: AC | PRN
  Administered 2019-10-04: 100 mL via INTRAVENOUS

## 2019-10-13 DIAGNOSIS — Z794 Long term (current) use of insulin: Secondary | ICD-10-CM | POA: Diagnosis not present

## 2019-10-13 DIAGNOSIS — E1165 Type 2 diabetes mellitus with hyperglycemia: Secondary | ICD-10-CM | POA: Diagnosis not present

## 2019-10-13 DIAGNOSIS — E781 Pure hyperglyceridemia: Secondary | ICD-10-CM | POA: Diagnosis not present

## 2019-10-17 DIAGNOSIS — E1165 Type 2 diabetes mellitus with hyperglycemia: Secondary | ICD-10-CM | POA: Diagnosis not present

## 2019-10-17 DIAGNOSIS — E781 Pure hyperglyceridemia: Secondary | ICD-10-CM | POA: Diagnosis not present

## 2019-10-17 DIAGNOSIS — E1129 Type 2 diabetes mellitus with other diabetic kidney complication: Secondary | ICD-10-CM | POA: Diagnosis not present

## 2019-10-17 DIAGNOSIS — E669 Obesity, unspecified: Secondary | ICD-10-CM | POA: Diagnosis not present

## 2019-10-17 DIAGNOSIS — Z794 Long term (current) use of insulin: Secondary | ICD-10-CM | POA: Diagnosis not present

## 2020-01-29 DIAGNOSIS — M25512 Pain in left shoulder: Secondary | ICD-10-CM | POA: Diagnosis not present

## 2020-01-29 DIAGNOSIS — E781 Pure hyperglyceridemia: Secondary | ICD-10-CM | POA: Diagnosis not present

## 2020-01-29 DIAGNOSIS — Z13220 Encounter for screening for lipoid disorders: Secondary | ICD-10-CM | POA: Diagnosis not present

## 2020-01-29 DIAGNOSIS — Z0000 Encounter for general adult medical examination without abnormal findings: Secondary | ICD-10-CM | POA: Diagnosis not present

## 2020-01-29 DIAGNOSIS — E1165 Type 2 diabetes mellitus with hyperglycemia: Secondary | ICD-10-CM | POA: Diagnosis not present

## 2020-01-29 DIAGNOSIS — E669 Obesity, unspecified: Secondary | ICD-10-CM | POA: Diagnosis not present

## 2020-02-07 DIAGNOSIS — Z1389 Encounter for screening for other disorder: Secondary | ICD-10-CM | POA: Diagnosis not present

## 2020-02-19 DIAGNOSIS — Z794 Long term (current) use of insulin: Secondary | ICD-10-CM | POA: Diagnosis not present

## 2020-02-19 DIAGNOSIS — E1165 Type 2 diabetes mellitus with hyperglycemia: Secondary | ICD-10-CM | POA: Diagnosis not present

## 2020-03-11 DIAGNOSIS — Z124 Encounter for screening for malignant neoplasm of cervix: Secondary | ICD-10-CM | POA: Diagnosis not present

## 2020-03-11 DIAGNOSIS — Z01419 Encounter for gynecological examination (general) (routine) without abnormal findings: Secondary | ICD-10-CM | POA: Diagnosis not present

## 2020-03-11 DIAGNOSIS — N941 Unspecified dyspareunia: Secondary | ICD-10-CM | POA: Diagnosis not present

## 2020-03-11 DIAGNOSIS — Z1231 Encounter for screening mammogram for malignant neoplasm of breast: Secondary | ICD-10-CM | POA: Diagnosis not present

## 2020-03-26 DIAGNOSIS — N941 Unspecified dyspareunia: Secondary | ICD-10-CM | POA: Diagnosis not present

## 2020-03-26 DIAGNOSIS — N83292 Other ovarian cyst, left side: Secondary | ICD-10-CM | POA: Diagnosis not present

## 2020-05-08 DIAGNOSIS — J069 Acute upper respiratory infection, unspecified: Secondary | ICD-10-CM | POA: Diagnosis not present

## 2020-05-14 DIAGNOSIS — H66001 Acute suppurative otitis media without spontaneous rupture of ear drum, right ear: Secondary | ICD-10-CM | POA: Diagnosis not present

## 2020-05-17 DIAGNOSIS — J019 Acute sinusitis, unspecified: Secondary | ICD-10-CM | POA: Diagnosis not present

## 2020-05-17 DIAGNOSIS — B9689 Other specified bacterial agents as the cause of diseases classified elsewhere: Secondary | ICD-10-CM | POA: Diagnosis not present

## 2020-05-17 DIAGNOSIS — H66003 Acute suppurative otitis media without spontaneous rupture of ear drum, bilateral: Secondary | ICD-10-CM | POA: Diagnosis not present

## 2020-06-10 DIAGNOSIS — H6503 Acute serous otitis media, bilateral: Secondary | ICD-10-CM | POA: Diagnosis not present

## 2020-06-14 DIAGNOSIS — E113393 Type 2 diabetes mellitus with moderate nonproliferative diabetic retinopathy without macular edema, bilateral: Secondary | ICD-10-CM | POA: Diagnosis not present

## 2020-07-07 DIAGNOSIS — R0989 Other specified symptoms and signs involving the circulatory and respiratory systems: Secondary | ICD-10-CM | POA: Diagnosis not present

## 2020-07-07 DIAGNOSIS — Z03818 Encounter for observation for suspected exposure to other biological agents ruled out: Secondary | ICD-10-CM | POA: Diagnosis not present

## 2020-07-07 DIAGNOSIS — U071 COVID-19: Secondary | ICD-10-CM | POA: Diagnosis not present

## 2020-08-26 DIAGNOSIS — Z794 Long term (current) use of insulin: Secondary | ICD-10-CM | POA: Diagnosis not present

## 2020-08-26 DIAGNOSIS — E1165 Type 2 diabetes mellitus with hyperglycemia: Secondary | ICD-10-CM | POA: Diagnosis not present

## 2020-08-26 DIAGNOSIS — R809 Proteinuria, unspecified: Secondary | ICD-10-CM | POA: Diagnosis not present

## 2020-08-26 DIAGNOSIS — E1129 Type 2 diabetes mellitus with other diabetic kidney complication: Secondary | ICD-10-CM | POA: Diagnosis not present

## 2020-08-28 DIAGNOSIS — E1169 Type 2 diabetes mellitus with other specified complication: Secondary | ICD-10-CM | POA: Diagnosis not present

## 2020-08-28 DIAGNOSIS — E669 Obesity, unspecified: Secondary | ICD-10-CM | POA: Diagnosis not present

## 2020-08-28 DIAGNOSIS — E1165 Type 2 diabetes mellitus with hyperglycemia: Secondary | ICD-10-CM | POA: Diagnosis not present

## 2020-11-19 DIAGNOSIS — R29898 Other symptoms and signs involving the musculoskeletal system: Secondary | ICD-10-CM | POA: Diagnosis not present

## 2020-11-19 DIAGNOSIS — R202 Paresthesia of skin: Secondary | ICD-10-CM | POA: Diagnosis not present

## 2020-11-19 DIAGNOSIS — R200 Anesthesia of skin: Secondary | ICD-10-CM | POA: Diagnosis not present

## 2020-11-19 DIAGNOSIS — M79641 Pain in right hand: Secondary | ICD-10-CM | POA: Diagnosis not present

## 2020-11-26 DIAGNOSIS — E1165 Type 2 diabetes mellitus with hyperglycemia: Secondary | ICD-10-CM | POA: Diagnosis not present

## 2020-12-17 DIAGNOSIS — Z794 Long term (current) use of insulin: Secondary | ICD-10-CM | POA: Diagnosis not present

## 2020-12-17 DIAGNOSIS — M25511 Pain in right shoulder: Secondary | ICD-10-CM | POA: Diagnosis not present

## 2020-12-17 DIAGNOSIS — E1165 Type 2 diabetes mellitus with hyperglycemia: Secondary | ICD-10-CM | POA: Diagnosis not present

## 2021-01-28 DIAGNOSIS — M25511 Pain in right shoulder: Secondary | ICD-10-CM | POA: Diagnosis not present

## 2021-01-28 DIAGNOSIS — M5412 Radiculopathy, cervical region: Secondary | ICD-10-CM | POA: Diagnosis not present

## 2021-02-03 DIAGNOSIS — S46911A Strain of unspecified muscle, fascia and tendon at shoulder and upper arm level, right arm, initial encounter: Secondary | ICD-10-CM | POA: Diagnosis not present

## 2021-02-03 DIAGNOSIS — M7591 Shoulder lesion, unspecified, right shoulder: Secondary | ICD-10-CM | POA: Diagnosis not present

## 2021-02-03 DIAGNOSIS — M25511 Pain in right shoulder: Secondary | ICD-10-CM | POA: Diagnosis not present

## 2021-02-03 DIAGNOSIS — M13811 Other specified arthritis, right shoulder: Secondary | ICD-10-CM | POA: Diagnosis not present

## 2021-02-12 DIAGNOSIS — M25511 Pain in right shoulder: Secondary | ICD-10-CM | POA: Diagnosis not present

## 2021-02-17 DIAGNOSIS — M67813 Other specified disorders of tendon, right shoulder: Secondary | ICD-10-CM | POA: Diagnosis not present

## 2021-03-24 DIAGNOSIS — R200 Anesthesia of skin: Secondary | ICD-10-CM | POA: Diagnosis not present

## 2021-03-24 DIAGNOSIS — R202 Paresthesia of skin: Secondary | ICD-10-CM | POA: Diagnosis not present

## 2021-04-09 ENCOUNTER — Other Ambulatory Visit: Payer: Self-pay | Admitting: Neurology

## 2021-04-09 DIAGNOSIS — R29898 Other symptoms and signs involving the musculoskeletal system: Secondary | ICD-10-CM

## 2021-04-21 ENCOUNTER — Ambulatory Visit
Admission: RE | Admit: 2021-04-21 | Discharge: 2021-04-21 | Disposition: A | Discharge status: Home / Self Care | Payer: BC Managed Care – PPO | Source: Ambulatory Visit | Attending: Neurology | Admitting: Neurology

## 2021-04-21 DIAGNOSIS — M2578 Osteophyte, vertebrae: Secondary | ICD-10-CM | POA: Diagnosis not present

## 2021-04-21 DIAGNOSIS — R29898 Other symptoms and signs involving the musculoskeletal system: Secondary | ICD-10-CM

## 2021-04-21 DIAGNOSIS — M47812 Spondylosis without myelopathy or radiculopathy, cervical region: Secondary | ICD-10-CM | POA: Diagnosis not present

## 2021-04-21 DIAGNOSIS — M4802 Spinal stenosis, cervical region: Secondary | ICD-10-CM | POA: Diagnosis not present

## 2021-04-21 DIAGNOSIS — R200 Anesthesia of skin: Secondary | ICD-10-CM | POA: Diagnosis not present

## 2021-05-07 DIAGNOSIS — M542 Cervicalgia: Secondary | ICD-10-CM | POA: Diagnosis not present

## 2021-05-07 DIAGNOSIS — M9931 Osseous stenosis of neural canal of cervical region: Secondary | ICD-10-CM | POA: Diagnosis not present

## 2021-05-07 DIAGNOSIS — M5412 Radiculopathy, cervical region: Secondary | ICD-10-CM | POA: Diagnosis not present

## 2021-05-07 DIAGNOSIS — M25511 Pain in right shoulder: Secondary | ICD-10-CM | POA: Diagnosis not present

## 2021-05-22 DIAGNOSIS — M5412 Radiculopathy, cervical region: Secondary | ICD-10-CM | POA: Diagnosis not present

## 2021-05-23 DIAGNOSIS — E119 Type 2 diabetes mellitus without complications: Secondary | ICD-10-CM | POA: Diagnosis not present

## 2021-05-27 DIAGNOSIS — E119 Type 2 diabetes mellitus without complications: Secondary | ICD-10-CM | POA: Diagnosis not present

## 2021-06-04 DIAGNOSIS — M5412 Radiculopathy, cervical region: Secondary | ICD-10-CM | POA: Diagnosis not present

## 2021-06-04 DIAGNOSIS — M542 Cervicalgia: Secondary | ICD-10-CM | POA: Diagnosis not present

## 2021-06-04 DIAGNOSIS — M9931 Osseous stenosis of neural canal of cervical region: Secondary | ICD-10-CM | POA: Diagnosis not present

## 2021-06-04 DIAGNOSIS — M25511 Pain in right shoulder: Secondary | ICD-10-CM | POA: Diagnosis not present

## 2021-07-04 DIAGNOSIS — E113293 Type 2 diabetes mellitus with mild nonproliferative diabetic retinopathy without macular edema, bilateral: Secondary | ICD-10-CM | POA: Diagnosis not present

## 2021-10-30 DIAGNOSIS — M5412 Radiculopathy, cervical region: Secondary | ICD-10-CM | POA: Diagnosis not present

## 2021-10-30 DIAGNOSIS — E1165 Type 2 diabetes mellitus with hyperglycemia: Secondary | ICD-10-CM | POA: Diagnosis not present

## 2021-10-30 DIAGNOSIS — M722 Plantar fascial fibromatosis: Secondary | ICD-10-CM | POA: Diagnosis not present

## 2021-10-30 DIAGNOSIS — M5416 Radiculopathy, lumbar region: Secondary | ICD-10-CM | POA: Diagnosis not present

## 2021-11-24 NOTE — Progress Notes (Unsigned)
Referring Physician:  Wilford Corner, PA-C [id number] [place],  [place] [address]  Primary Physician:  Wilford Corner, PA-C  History of Present Illness: 11/24/2021 Ms. Laura Mitchell is here today with a chief complaint of intermittent neck pain, upper remedy pain, difficulty lifting her arms.  She has decreased range of motion in both of her arms.  She is unable to lift her arms above her shoulders.  She has pain in her upper arms but not below her elbows.  She has tingling and numbness in her hands at nighttime.  This affects all 5 of her digits.  Her pain in her shoulders has been going on for almost 1 year in the right arm in about 4 months in the left arm.  It is made worse by lifting her arms, moving her arms behind her, and pulling her pants up.  Turning her head also bothers her.  Nothing is really helped.   Bowel/Bladder Dysfunction: none  Conservative measures:  Physical therapy:  has done some home exercises but no formal physical therapy Multimodal medical therapy including regular antiinflammatories: advil, prednisone Injections:  has received epidural steroid injections 05/22/21: Right C5-6 TF ESI (no relief) 01/24/21: Right subacromial bursa injection at [facility](no relief)  Past Surgery: denies  Laura Mitchell has no symptoms of cervical myelopathy.  The symptoms are causing a significant impact on the patient's life.   Review of Systems:  A 10 point review of systems is negative, except for the pertinent positives and negatives detailed in the HPI.  Past Medical History: Past Medical History:  Diagnosis Date   Diabetes mellitus without complication (HCC)     Past Surgical History: Past Surgical History:  Procedure Laterality Date   CESAREAN SECTION     CHOLECYSTECTOMY      Allergies: Allergies as of 11/25/2021 - Review Complete 10/04/2019  Allergen Reaction Noted   Metformin and related Nausea And Vomiting 02/23/2018     Medications: No outpatient medications have been marked as taking for the 11/25/21 encounter (Appointment) with Venetia Night, MD.    Social History: Social History   Tobacco Use   Smoking status: Never   Smokeless tobacco: Never  Vaping Use   Vaping Use: Never used  Substance Use Topics   Alcohol use: Yes    Comment: occasional   Drug use: Never    Family Medical History: Family History  Problem Relation Age of Onset   Diabetes Mother    Kidney disease Mother    Heart disease Mother    Diabetes Father    Arthritis Father    Heart disease Maternal Grandmother    Heart disease Maternal Grandfather    Alzheimer's disease Paternal Grandmother     Physical Examination: There were no vitals filed for this visit.  General: Patient is well developed, well nourished, calm, collected, and in no apparent distress. Attention to examination is appropriate.  Neck:   Supple.  Full range of motion.  Respiratory: Patient is breathing without any difficulty.   NEUROLOGICAL:     Awake, alert, oriented to person, place, and time.  Speech is clear and fluent. Fund of knowledge is appropriate.   Cranial Nerves: Pupils equal round and reactive to light.  Facial tone is symmetric.  Facial sensation is symmetric. Shoulder shrug is symmetric. Tongue protrusion is midline.  There is no pronator drift.  ROM of spine: full.    Strength: Side Biceps Triceps Deltoid Interossei Grip Wrist Ext. Wrist Flex.  R 5 5 4 5 5 5 5   L 5 5 4 5 5 5 5    Side Iliopsoas Quads Hamstring PF DF EHL  R 5 5 5 5 5 5   L 5 5 5 5 5 5    Reflexes are 1+ and symmetric at the biceps, triceps, brachioradialis, patella and achilles.   Hoffman's is absent.   Bilateral upper and lower extremity sensation is intact to light touch.    No evidence of dysmetria noted.  Gait is normal.    She has discomfort with anterior shoulder palpation bilaterally.  She has discomfort with internal and external rotation  of her rotator cuff.   Medical Decision Making  Imaging: MRI C spine 04/21/2021 Disc levels:   Mild-to-moderate disc degeneration at C5-C6 and C7-T1. No more than mild disc degeneration at the remaining levels.   C2-C3: No significant disc herniation or stenosis.   C3-C4: No significant disc herniation or stenosis.   C4-C5: Shallow disc bulge. Uncovertebral hypertrophy on the right. Minimal partial effacement of the ventral thecal sac (without spinal cord mass effect). Mild right neural foraminal narrowing.   C5-C6: Disc bulge with right-sided disc osteophyte ridge/uncinate hypertrophy. The disc bulge partially effaces the ventral thecal sac, resulting in mild spinal canal stenosis (without spinal cord mass effect). Mild to moderate right neural foraminal narrowing.   C6-C7: No significant disc herniation or spinal canal stenosis. Mild uncovertebral hypertrophy on the right resulting in mild relative right neural foraminal narrowing.   C7-T1: Tiny central disc protrusion. No significant spinal canal or foraminal stenosis.   IMPRESSION: Intermittently motion degraded examination.   Cervical spondylosis, as outlined.   Foraminal stenosis on the right at C4-C5 (mild), on the right at C5-C6 (mild-to-moderate) and on the right at C6-C7 (mild).   No more than mild spinal canal narrowing.   Disc degeneration is greatest at C5-C6 and C7-T1 (mild to moderate at these levels). Associated mild degenerative endplate edema at .   Nonspecific straightening of the expected cervical lordosis.     Electronically Signed   By: D.O.   On: 04/21/2021 19:18  I have personally reviewed the images and agree with the above interpretation.  Assessment and Plan: Ms. [name] is a pleasant 46 y.o. female with bilateral upper extremity pain concerning for primary shoulder pathology.  She did not have significant cervical spine findings.  I do not think that this is secondary  to her cervical spine findings.  I would like to refer her to sports medicine.  I do not think that cervical spine surgery or intervention will be worthwhile.   I spent a total of 30 minutes in face-to-face and non-face-to-face activities related to this patient's care today.  Thank you for involving me in the care of this patient.      Torres Hardenbrook K. Jackey Loge MD, [facility] Neurosurgery

## 2021-11-25 ENCOUNTER — Encounter: Payer: Self-pay | Admitting: Neurosurgery

## 2021-11-25 ENCOUNTER — Ambulatory Visit (INDEPENDENT_AMBULATORY_CARE_PROVIDER_SITE_OTHER): Payer: BC Managed Care – PPO | Admitting: Neurosurgery

## 2021-11-25 VITALS — BP 135/80 | HR 80 | Ht 63.0 in | Wt 179.4 lb

## 2021-11-25 DIAGNOSIS — G8929 Other chronic pain: Secondary | ICD-10-CM | POA: Diagnosis not present

## 2021-11-25 DIAGNOSIS — M25511 Pain in right shoulder: Secondary | ICD-10-CM | POA: Diagnosis not present

## 2021-11-25 DIAGNOSIS — M25512 Pain in left shoulder: Secondary | ICD-10-CM | POA: Diagnosis not present

## 2021-12-08 ENCOUNTER — Encounter: Payer: Self-pay | Admitting: Family Medicine

## 2021-12-08 ENCOUNTER — Ambulatory Visit (INDEPENDENT_AMBULATORY_CARE_PROVIDER_SITE_OTHER): Payer: BC Managed Care – PPO | Admitting: Family Medicine

## 2021-12-08 VITALS — BP 122/82 | HR 88 | Ht 63.0 in | Wt 178.4 lb

## 2021-12-08 DIAGNOSIS — G8929 Other chronic pain: Secondary | ICD-10-CM

## 2021-12-08 DIAGNOSIS — M25512 Pain in left shoulder: Secondary | ICD-10-CM

## 2021-12-08 DIAGNOSIS — M25511 Pain in right shoulder: Secondary | ICD-10-CM | POA: Diagnosis not present

## 2021-12-08 MED ORDER — DICLOFENAC SODIUM 75 MG PO TBEC: 75.0000 mg | DELAYED_RELEASE_TABLET | Freq: Two times a day (BID) | ORAL | 0 refills | Status: DC

## 2021-12-09 ENCOUNTER — Encounter: Payer: BC Managed Care – PPO | Admitting: Family Medicine

## 2021-12-10 DIAGNOSIS — G8929 Other chronic pain: Secondary | ICD-10-CM | POA: Insufficient documentation

## 2021-12-10 NOTE — Progress Notes (Signed)
     Primary Care / Sports Medicine Office Visit  Patient Information:  Patient ID: [name], female DOB: Feb 03, 1975 Age: 46 y.o. MRN: [id number]   Laura Mitchell is a pleasant 46 y.o. female presenting with the following:  Chief Complaint  Patient presents with   Shoulder Pain    Both, left worst. Right for year and left 3 months ago. Has tried prednisone no relief. Had cortisone injection from [facility] with no relief.     Vitals:   12/08/21 1435  BP: 122/82  Pulse: 88  SpO2: 99%   Vitals:   12/08/21 1435  Weight: 178 lb 6.4 oz (80.9 kg)  Height: 5\' 3"  (1.6 m)   Body mass index is 31.6 kg/m.  No results found.   Independent interpretation of notes and tests performed by another provider:   None  Procedures performed:   None  Pertinent History, Exam, Impression, and Recommendations:   Problem List Items Addressed This Visit       Other   Chronic pain of both shoulders - Primary    LHD patient presenting for evaluation of chronic bilateral shoulder pain, right x 1 year, left x 3 months, atraumatic. Aggravated by ADLs, has had prior corticosteroid injection to unspecified portion of right shoulder without response.  Examination with significant kyphotic posturing, ROM with painful overhead motion, RC testing with painful strength primarily with isolated supraspinatus testing. Positive impingement noted, secondary involvement at glenohumeral joints bilaterally.  We discussed treatments and due to extent of involvement bilaterally, plan for initial course of scheduled NSAID, close follow-up, and low threshold to advance to corticosteroid injection(s).      Relevant Medications   diclofenac (VOLTAREN) 75 MG EC tablet     Orders & Medications Meds ordered this encounter  Medications   diclofenac (VOLTAREN) 75 MG EC tablet    Sig: Take 1 tablet (75 mg total) by mouth 2 (two) times daily.    Dispense:  60 tablet    Refill:  0   No orders of the  defined types were placed in this encounter.    Return in about 2 weeks (around 12/22/2021).     12/24/2021, MD, [facility]   Primary Care Sports Medicine Primary Care and Sports Medicine at [facility]

## 2021-12-10 NOTE — Patient Instructions (Signed)
-   Dose meloxicam daily with food - Maintain ROM as tolerated  - Return in 2 weeks

## 2021-12-10 NOTE — Assessment & Plan Note (Signed)
LHD patient presenting for evaluation of chronic bilateral shoulder pain, right x 1 year, left x 3 months, atraumatic. Aggravated by ADLs, has had prior corticosteroid injection to unspecified portion of right shoulder without response.  Examination with significant kyphotic posturing, ROM with painful overhead motion, RC testing with painful strength primarily with isolated supraspinatus testing. Positive impingement noted, secondary involvement at glenohumeral joints bilaterally.  We discussed treatments and due to extent of involvement bilaterally, plan for initial course of scheduled NSAID, close follow-up, and low threshold to advance to corticosteroid injection(s).

## 2021-12-12 ENCOUNTER — Encounter: Payer: BC Managed Care – PPO | Admitting: Family Medicine

## 2021-12-22 ENCOUNTER — Encounter: Payer: Self-pay | Admitting: Family Medicine

## 2021-12-22 ENCOUNTER — Inpatient Hospital Stay (INDEPENDENT_AMBULATORY_CARE_PROVIDER_SITE_OTHER): Payer: BC Managed Care – PPO | Admitting: Radiology

## 2021-12-22 ENCOUNTER — Ambulatory Visit (INDEPENDENT_AMBULATORY_CARE_PROVIDER_SITE_OTHER): Payer: BC Managed Care – PPO | Admitting: Family Medicine

## 2021-12-22 VITALS — BP 120/80 | HR 80 | Ht 63.0 in | Wt 176.0 lb

## 2021-12-22 DIAGNOSIS — M7542 Impingement syndrome of left shoulder: Secondary | ICD-10-CM | POA: Diagnosis not present

## 2021-12-22 DIAGNOSIS — M7541 Impingement syndrome of right shoulder: Secondary | ICD-10-CM

## 2021-12-22 DIAGNOSIS — M7501 Adhesive capsulitis of right shoulder: Secondary | ICD-10-CM | POA: Diagnosis not present

## 2021-12-22 DIAGNOSIS — G8929 Other chronic pain: Secondary | ICD-10-CM

## 2021-12-22 DIAGNOSIS — M25511 Pain in right shoulder: Secondary | ICD-10-CM | POA: Diagnosis not present

## 2021-12-22 DIAGNOSIS — M7521 Bicipital tendinitis, right shoulder: Secondary | ICD-10-CM | POA: Insufficient documentation

## 2021-12-22 DIAGNOSIS — M25512 Pain in left shoulder: Secondary | ICD-10-CM

## 2021-12-22 MED ORDER — CELECOXIB 200 MG PO CAPS: ORAL_CAPSULE | ORAL | 0 refills | Status: DC

## 2021-12-22 MED ORDER — METHOCARBAMOL 500 MG PO TABS: 500.0000 mg | ORAL_TABLET | Freq: Three times a day (TID) | ORAL | 0 refills | Status: AC | PRN

## 2021-12-22 NOTE — Assessment & Plan Note (Signed)
Persistent symptoms despite scheduled diclofenac twice daily x 2 weeks.  Examination shows focality to the right glenohumeral articulation where there is added concern for adhesive capsulitis, bilateral supraspinatus tendinitis, and right biceps tendinitis.  See additional assessment(s) for plan details.  Methocarbamol and Celebrex prescribed as adjunct pain control options.

## 2021-12-22 NOTE — Assessment & Plan Note (Signed)
See additional assessment(s) for plan details. 

## 2021-12-22 NOTE — Telephone Encounter (Signed)
Please review.  KP

## 2021-12-22 NOTE — Progress Notes (Signed)
Primary Care / Sports Medicine Office Visit  Patient Information:  Patient ID: Laura Mitchell, female DOB: 11-18-1975 Age: 46 y.o. MRN: [id number]   Laura Mitchell is a pleasant 46 y.o. female presenting with the following:  Chief Complaint  Patient presents with   Chronic pain of both shoulders    Vitals:   12/22/21 0818  BP: 120/80  Pulse: 80  SpO2: 98%   Vitals:   12/22/21 0818  Weight: 176 lb (79.8 kg)  Height: 5\' 3"  (1.6 m)   Body mass index is 31.18 kg/m.  No results found.   Independent interpretation of notes and tests performed by another provider:   None  Procedures performed:   Procedure:  Injection of left subacromial shoulder under ultrasound guidance. Ultrasound guidance utilized for in-plane approach to left subacromial space, supraspinatus visualized without clear tendinopathy Samsung HS60 device utilized with permanent recording / reporting. Verbal informed consent obtained and verified. Skin prepped in a sterile fashion. Ethyl chloride for topical local analgesia.  Completed without difficulty and tolerated well. Medication:2.5 mL lidocaine 1% without epinephrine utilized for needle placement anesthetic Advised to contact for fevers/chills, erythema, induration, drainage, or persistent bleeding.  Procedure:  Injection of right subacromial shoulder under ultrasound guidance. Ultrasound guidance utilized for in-plane approach to right subacromial space, insertional component of visualized tendon sonographically consistent with tendinopathy Samsung HS60 device utilized with permanent recording / reporting. Verbal informed consent obtained and verified. Skin prepped in a sterile fashion. Ethyl chloride for topical local analgesia.  Completed without difficulty and tolerated well. Medication:2.5 mL lidocaine 1% without epinephrine utilized for needle placement anesthetic Advised to contact for fevers/chills, erythema, induration, drainage, or  persistent bleeding.  Procedure:  Injection of right biceps tendon sheath under ultrasound guidance. Ultrasound guidance utilized for out of plane approach to the right biceps tendon sheath, no peritendinous fluid noted Samsung HS60 device utilized with permanent recording / reporting. Verbal informed consent obtained and verified. Skin prepped in a sterile fashion. Ethyl chloride for topical local analgesia.  Completed without difficulty and tolerated well. Medication:2.5 mL lidocaine 1% without epinephrine utilized for needle placement anesthetic Advised to contact for fevers/chills, erythema, induration, drainage, or persistent bleeding.  Procedure:  Injection of right glenohumeral joint under ultrasound guidance. Ultrasound guidance utilized for out of plane approach to the right glenohumeral joint, dynamic motion visualized, no effusion Samsung HS60 device utilized with permanent recording / reporting. Verbal informed consent obtained and verified. Skin prepped in a sterile fashion. Ethyl chloride for topical local analgesia.  Completed without difficulty and tolerated well. Medication:2.5 mL lidocaine 1% without epinephrine utilized for needle placement anesthetic Advised to contact for fevers/chills, erythema, induration, drainage, or persistent bleeding.  Pertinent History, Exam, Impression, and Recommendations:   Problem List Items Addressed This Visit       Musculoskeletal and Integument   Adhesive capsulitis of right shoulder    Noted on today's examination following intra-articular anesthetic injection, there is mechanical obstruction of joint, given overall clinical picture including duration of symptoms, comorbid diabetes mellitus type 2, adhesive capsulitis concern.  Will await response from patient regarding diagnostic injections to determine next steps, home exercises versus PT discussed.      Relevant Medications   methocarbamol (ROBAXIN) 500 MG tablet   celecoxib  (CELEBREX) 200 MG capsule   Other Relevant Orders   LIMITED JOINT SPACE STRUCTURES UP BILAT   Shoulder impingement syndrome, right    Patient returns for follow-up to chronic bilateral shoulder pain, did  not note any relief from diclofenac regimen x 2 weeks, exam does show focality to the supraspinatus bilaterally, biceps tendon on the right, glenohumeral joint on the right where there is added concern for adhesive capsulitis. See additional assessment(s) for plan details.  We did discuss next steps and, given patient's comorbid diabetes mellitus type 2, in an effort to reduce overall risk, she did elect to proceed with diagnostic lidocaine-bupivacaine only injections at the subacromial space on the right and left shoulders, right biceps tendon sheath, and right glenohumeral joint.  She is to provide a status update via Education officer, museum.  Did provide additional as needed Robaxin and Celebrex.  Recalcitrant/persistent symptoms to be further evaluated by spine group if indicated versus advanced to corticosteroid injections.      Relevant Medications   methocarbamol (ROBAXIN) 500 MG tablet   celecoxib (CELEBREX) 200 MG capsule   Other Relevant Orders   [place] LIMITED JOINT SPACE STRUCTURES UP BILAT   Biceps tendinitis, right    See additional assessment(s) for plan details.      Relevant Orders   [place] LIMITED JOINT SPACE STRUCTURES UP BILAT   Shoulder impingement syndrome, left    See additional assessment(s) for plan details.      Relevant Medications   methocarbamol (ROBAXIN) 500 MG tablet   celecoxib (CELEBREX) 200 MG capsule   Other Relevant Orders   [place] LIMITED JOINT SPACE STRUCTURES UP BILAT     Other   Chronic pain of both shoulders - Primary    Persistent symptoms despite scheduled diclofenac twice daily x 2 weeks.  Examination shows focality to the right glenohumeral articulation where there is added concern for adhesive capsulitis, bilateral supraspinatus tendinitis, and right  biceps tendinitis.  See additional assessment(s) for plan details.  Methocarbamol and Celebrex prescribed as adjunct pain control options.      Relevant Medications   methocarbamol (ROBAXIN) 500 MG tablet   celecoxib (CELEBREX) 200 MG capsule     Orders & Medications Meds ordered this encounter  Medications   methocarbamol (ROBAXIN) 500 MG tablet    Sig: Take 1 tablet (500 mg total) by mouth every 8 (eight) hours as needed for muscle spasms.    Dispense:  90 tablet    Refill:  0   celecoxib (CELEBREX) 200 MG capsule    Sig: One to 2 tablets by mouth daily as needed for pain.    Dispense:  60 capsule    Refill:  0   Orders Placed This Encounter  Procedures   [place] LIMITED JOINT SPACE STRUCTURES UP BILAT     Return if symptoms worsen or fail to improve.     Jerrol Banana, MD, [facility]   Primary Care Sports Medicine Primary Care and Sports Medicine at [facility]

## 2021-12-22 NOTE — Assessment & Plan Note (Signed)
Patient returns for follow-up to chronic bilateral shoulder pain, did not note any relief from diclofenac regimen x 2 weeks, exam does show focality to the supraspinatus bilaterally, biceps tendon on the right, glenohumeral joint on the right where there is added concern for adhesive capsulitis. See additional assessment(s) for plan details.  We did discuss next steps and, given patient's comorbid diabetes mellitus type 2, in an effort to reduce overall risk, she did elect to proceed with diagnostic lidocaine-bupivacaine only injections at the subacromial space on the right and left shoulders, right biceps tendon sheath, and right glenohumeral joint.  She is to provide a status update via Education officer, museum.  Did provide additional as needed Robaxin and Celebrex.  Recalcitrant/persistent symptoms to be further evaluated by spine group if indicated versus advanced to corticosteroid injections.

## 2021-12-22 NOTE — Assessment & Plan Note (Signed)
Noted on today's examination following intra-articular anesthetic injection, there is mechanical obstruction of joint, given overall clinical picture including duration of symptoms, comorbid diabetes mellitus type 2, adhesive capsulitis concern.  Will await response from patient regarding diagnostic injections to determine next steps, home exercises versus PT discussed.

## 2021-12-22 NOTE — Patient Instructions (Signed)
You have just been given a diagnostic shoulder injection to reduce pain and determine the source of shoulder pain. After the injection you may notice immediate relief of pain as a result of the Lidocaine. It is important to relatively rest the area of the injection for 24 to 48 hours after the injection. If you have pain, simply rest the joint and use ice. If you can tolerate over the counter medications, you can try Tylenol for added relief per package instructions. - As above, track change in symptoms and update [place] via MyChart message - Can use Robaxin for muscle tightness pain, side effect is drowsiness - Can use Celebrex 1-2 times / day for pain

## 2021-12-24 ENCOUNTER — Other Ambulatory Visit: Payer: Self-pay | Admitting: Family Medicine

## 2021-12-24 DIAGNOSIS — G8929 Other chronic pain: Secondary | ICD-10-CM

## 2021-12-24 DIAGNOSIS — M7541 Impingement syndrome of right shoulder: Secondary | ICD-10-CM

## 2021-12-24 DIAGNOSIS — M7542 Impingement syndrome of left shoulder: Secondary | ICD-10-CM

## 2021-12-24 DIAGNOSIS — M7501 Adhesive capsulitis of right shoulder: Secondary | ICD-10-CM

## 2021-12-24 DIAGNOSIS — M7521 Bicipital tendinitis, right shoulder: Secondary | ICD-10-CM

## 2021-12-24 MED ORDER — DULOXETINE HCL 30 MG PO CPEP: ORAL_CAPSULE | ORAL | 0 refills | Status: AC

## 2022-01-04 ENCOUNTER — Other Ambulatory Visit: Payer: Self-pay | Admitting: Family Medicine

## 2022-01-04 DIAGNOSIS — G8929 Other chronic pain: Secondary | ICD-10-CM

## 2022-01-06 NOTE — Telephone Encounter (Signed)
Unable to refill per protocol, Rx expired. Medication was discontinued 12/22/21. Will refuse.  Requested Prescriptions  Pending Prescriptions Disp Refills   diclofenac (VOLTAREN) 75 MG EC tablet [Pharmacy Med Name: DICLOFENAC SOD EC 75 MG TAB] 60 tablet 0    Sig: TAKE 1 TABLET BY MOUTH TWICE A DAY     Analgesics:  NSAIDS Failed - 01/04/2022  9:13 AM      Failed - Manual Review: Labs are only required if the patient has taken medication for more than 8 weeks.      Failed - Cr in normal range and within 360 days    Creat  Date Value Ref Range Status  02/23/2018 0.74 0.50 - 1.10 mg/dL Final   Creatinine, Ser  Date Value Ref Range Status  09/26/2019 0.95 0.44 - 1.00 mg/dL Final   Creatinine, Urine  Date Value Ref Range Status  02/23/2018 58 20 - 275 mg/dL Final         Failed - HGB in normal range and within 360 days    Hemoglobin  Date Value Ref Range Status  09/26/2019 12.5 12.0 - 15.0 g/dL Final         Failed - PLT in normal range and within 360 days    Platelets  Date Value Ref Range Status  09/26/2019 184 150 - 400 K/uL Final         Failed - HCT in normal range and within 360 days    HCT  Date Value Ref Range Status  09/26/2019 36.7 36.0 - 46.0 % Final         Failed - eGFR is 30 or above and within 360 days    GFR, Est African American  Date Value Ref Range Status  02/23/2018 117 > OR = 60 mL/min/1.83m Final   GFR calc Af Amer  Date Value Ref Range Status  09/26/2019 >60 >60 mL/min Final   GFR, Est Non African American  Date Value Ref Range Status  02/23/2018 101 > OR = 60 mL/min/1.733mFinal   GFR calc non Af Amer  Date Value Ref Range Status  09/26/2019 >60 >60 mL/min Final         Passed - Patient is not pregnant      Passed - Valid encounter within last 12 months    Recent Outpatient Visits           2 weeks ago Chronic pain of both shoulders   [place] [facility]   4 weeks  ago Chronic pain of both shoulders   [place] [facility]   2 years ago No-show for appointment   [facility]   2 years ago Abnormal urine odor   [place]   3 years ago Acute nonintractable headache, unspecified headache type   [facility] [place]

## 2022-01-17 ENCOUNTER — Other Ambulatory Visit: Payer: Self-pay | Admitting: Family Medicine

## 2022-01-19 NOTE — Telephone Encounter (Signed)
Requested medication (s) are due for refill today: see below  Requested medication (s) are on the active medication list: yes  Last refill:  12/24/21  Future visit scheduled: no  Notes to clinic:  pharmacy requesting 90 DS. Please advise     Requested Prescriptions  Pending Prescriptions Disp Refills   DULoxetine (CYMBALTA) 30 MG capsule [Pharmacy Med Name: DULOXETINE HCL DR 30 MG CAP] 159 capsule 1    Sig: TAKE 1 CAPSULE BY MOUTH EVERY EVENING FOR 7 DAYS THEN 2 CAPSULES EVERY [place]     Psychiatry: Antidepressants - SNRI - duloxetine Failed - 01/17/2022  9:32 AM      Failed - Cr in normal range and within 360 days    Creat  Date Value Ref Range Status  02/23/2018 0.74 0.50 - 1.10 mg/dL Final   Creatinine, Ser  Date Value Ref Range Status  09/26/2019 0.95 0.44 - 1.00 mg/dL Final   Creatinine, Urine  Date Value Ref Range Status  02/23/2018 58 20 - 275 mg/dL Final         Failed - eGFR is 30 or above and within 360 days    GFR, Est African American  Date Value Ref Range Status  02/23/2018 117 > OR = 60 mL/min/1.56m2 Final   GFR calc Af Amer  Date Value Ref Range Status  09/26/2019 >60 >60 mL/min Final   GFR, Est Non African American  Date Value Ref Range Status  02/23/2018 101 > OR = 60 mL/min/1.37m2 Final   GFR calc non Af Amer  Date Value Ref Range Status  09/26/2019 >60 >60 mL/min Final         Failed - Completed PHQ-2 or PHQ-9 in the last 360 days      Passed - Last BP in normal range    BP Readings from Last 1 Encounters:  12/22/21 120/80         Passed - Valid encounter within last 6 months    Recent Outpatient Visits           4 weeks ago Chronic pain of both shoulders   [place] [facility] at [place], Earley Abide, MD   1 month ago Chronic pain of both shoulders   [place] [facility] at [place], Earley Abide, MD   2 years ago No-show for appointment   [facility] Malkin, MD   2 years ago Abnormal urine odor   [place], MD   3 years ago Acute nonintractable headache, unspecified headache type   [place], Astrid Divine, [place]

## 2022-01-19 NOTE — Telephone Encounter (Signed)
Requested medication (s) are due for refill today: see below  Requested medication (s) are on the active medication list: yes  Last refill:  12/24/21  Future visit scheduled: no  Notes to clinic:  pharmacy requesting 90 Day Supply. Please advise      Requested Prescriptions  Pending Prescriptions Disp Refills   DULoxetine (CYMBALTA) 30 MG capsule [Pharmacy Med Name: DULOXETINE HCL DR 30 MG CAP] 159 capsule 1    Sig: TAKE 1 CAPSULE BY MOUTH EVERY EVENING FOR 7 DAYS THEN 2 CAPSULES EVERY [place]     Psychiatry: Antidepressants - SNRI - duloxetine Failed - 01/17/2022  9:32 AM      Failed - Cr in normal range and within 360 days    Creat  Date Value Ref Range Status  02/23/2018 0.74 0.50 - 1.10 mg/dL Final   Creatinine, Ser  Date Value Ref Range Status  09/26/2019 0.95 0.44 - 1.00 mg/dL Final   Creatinine, Urine  Date Value Ref Range Status  02/23/2018 58 20 - 275 mg/dL Final         Failed - eGFR is 30 or above and within 360 days    GFR, Est African American  Date Value Ref Range Status  02/23/2018 117 > OR = 60 mL/min/1.30m2 Final   GFR calc Af Amer  Date Value Ref Range Status  09/26/2019 >60 >60 mL/min Final   GFR, Est Non African American  Date Value Ref Range Status  02/23/2018 101 > OR = 60 mL/min/1.22m2 Final   GFR calc non Af Amer  Date Value Ref Range Status  09/26/2019 >60 >60 mL/min Final         Failed - Completed PHQ-2 or PHQ-9 in the last 360 days      Passed - Last BP in normal range    BP Readings from Last 1 Encounters:  12/22/21 120/80         Passed - Valid encounter within last 6 months    Recent Outpatient Visits           4 weeks ago Chronic pain of both shoulders   [place] [facility] at [place], Earley Abide, MD   1 month ago Chronic pain of both shoulders   [place] [facility] at [place], Earley Abide, MD   2 years ago No-show for appointment   [facility] Towanda Malkin, MD   2 years ago Abnormal urine odor   [place], MD   3 years ago Acute nonintractable headache, unspecified headache type   [facility], Astrid Divine, [place]

## 2022-01-22 ENCOUNTER — Other Ambulatory Visit: Payer: Self-pay | Admitting: Family Medicine

## 2022-01-22 DIAGNOSIS — G8929 Other chronic pain: Secondary | ICD-10-CM

## 2022-01-22 NOTE — Telephone Encounter (Signed)
Requested medication (s) are due for refill today:yes  Requested medication (s) are on the active medication list: yes  Last refill:  12/22/21  Future visit scheduled:yes  Notes to clinic:  Unable to refill per protocol due to failed labs, no updated results.      Requested Prescriptions  Pending Prescriptions Disp Refills   celecoxib (CELEBREX) 200 MG capsule [Pharmacy Med Name: CELECOXIB 200 MG CAPSULE] 60 capsule 0    Sig: TAKE 1 TO 2 TABLETS BY MOUTH DAILY AS NEEDED FOR PAIN     Analgesics:  COX2 Inhibitors Failed - 01/22/2022  1:34 AM      Failed - Manual Review: Labs are only required if the patient has taken medication for more than 8 weeks.      Failed - HGB in normal range and within 360 days    Hemoglobin  Date Value Ref Range Status  09/26/2019 12.5 12.0 - 15.0 g/dL Final         Failed - Cr in normal range and within 360 days    Creat  Date Value Ref Range Status  02/23/2018 0.74 0.50 - 1.10 mg/dL Final   Creatinine, Ser  Date Value Ref Range Status  09/26/2019 0.95 0.44 - 1.00 mg/dL Final   Creatinine, Urine  Date Value Ref Range Status  02/23/2018 58 20 - 275 mg/dL Final         Failed - HCT in normal range and within 360 days    HCT  Date Value Ref Range Status  09/26/2019 36.7 36.0 - 46.0 % Final         Failed - AST in normal range and within 360 days    AST  Date Value Ref Range Status  09/26/2019 18 15 - 41 U/L Final         Failed - ALT in normal range and within 360 days    ALT  Date Value Ref Range Status  09/26/2019 21 0 - 44 U/L Final         Failed - eGFR is 30 or above and within 360 days    GFR, Est African American  Date Value Ref Range Status  02/23/2018 117 > OR = 60 mL/min/1.23m2 Final   GFR calc Af Amer  Date Value Ref Range Status  09/26/2019 >60 >60 mL/min Final   GFR, Est Non African American  Date Value Ref Range Status  02/23/2018 101 > OR = 60 mL/min/1.23m2 Final   GFR calc non Af Amer  Date Value Ref Range  Status  09/26/2019 >60 >60 mL/min Final         Passed - Patient is not pregnant      Passed - Valid encounter within last 12 months    Recent Outpatient Visits           1 month ago Chronic pain of both shoulders   [place] [facility] at [place], Earley Abide, MD   1 month ago Chronic pain of both shoulders   [place] [facility] at [place], Earley Abide, MD   2 years ago No-show for appointment   [facility] Malkin, MD   2 years ago Abnormal urine odor   [place], MD   3 years ago Acute nonintractable headache, unspecified headache type   [place], Astrid Divine, [place]

## 2022-01-26 NOTE — Telephone Encounter (Signed)
Can you send this referral to [facility] or [facility]?

## 2022-02-19 DIAGNOSIS — E119 Type 2 diabetes mellitus without complications: Secondary | ICD-10-CM | POA: Diagnosis not present

## 2022-02-24 DIAGNOSIS — E669 Obesity, unspecified: Secondary | ICD-10-CM | POA: Diagnosis not present

## 2022-02-24 DIAGNOSIS — E119 Type 2 diabetes mellitus without complications: Secondary | ICD-10-CM | POA: Diagnosis not present

## 2022-02-24 DIAGNOSIS — E1169 Type 2 diabetes mellitus with other specified complication: Secondary | ICD-10-CM | POA: Diagnosis not present

## 2022-08-08 ENCOUNTER — Emergency Department: Payer: BC Managed Care – PPO

## 2022-08-08 ENCOUNTER — Other Ambulatory Visit: Payer: Self-pay

## 2022-08-08 ENCOUNTER — Emergency Department
Admission: EM | Admit: 2022-08-08 | Discharge: 2022-08-09 | Disposition: A | Discharge status: Home / Self Care | Payer: BC Managed Care – PPO | Attending: Emergency Medicine | Admitting: Emergency Medicine

## 2022-08-08 DIAGNOSIS — E162 Hypoglycemia, unspecified: Secondary | ICD-10-CM | POA: Diagnosis not present

## 2022-08-08 DIAGNOSIS — Z9049 Acquired absence of other specified parts of digestive tract: Secondary | ICD-10-CM | POA: Diagnosis not present

## 2022-08-08 DIAGNOSIS — R1031 Right lower quadrant pain: Secondary | ICD-10-CM | POA: Diagnosis not present

## 2022-08-08 DIAGNOSIS — N854 Malposition of uterus: Secondary | ICD-10-CM | POA: Diagnosis not present

## 2022-08-08 DIAGNOSIS — R102 Pelvic and perineal pain: Secondary | ICD-10-CM | POA: Diagnosis not present

## 2022-08-08 DIAGNOSIS — E11649 Type 2 diabetes mellitus with hypoglycemia without coma: Secondary | ICD-10-CM | POA: Diagnosis not present

## 2022-08-08 DIAGNOSIS — N939 Abnormal uterine and vaginal bleeding, unspecified: Secondary | ICD-10-CM | POA: Diagnosis not present

## 2022-08-08 LAB — COMPREHENSIVE METABOLIC PANEL
ALT: 19 U/L (ref 0–44)
AST: 20 U/L (ref 15–41)
Albumin: 4.1 g/dL (ref 3.5–5.0)
Alkaline Phosphatase: 45 U/L (ref 38–126)
Anion gap: 7 (ref 5–15)
BUN: 13 mg/dL (ref 6–20)
CO2: 24 mmol/L (ref 22–32)
Calcium: 8.7 mg/dL — ABNORMAL LOW (ref 8.9–10.3)
Chloride: 107 mmol/L (ref 98–111)
Creatinine, Ser: 0.81 mg/dL (ref 0.44–1.00)
GFR, Estimated: >60 mL/min (ref >60)
Glucose, Bld: 82 mg/dL (ref 70–99)
Potassium: 4 mmol/L (ref 3.5–5.1)
Sodium: 138 mmol/L (ref 135–145)
Total Bilirubin: 0.5 mg/dL (ref 0.3–1.2)
Total Protein: 7.3 g/dL (ref 6.5–8.1)

## 2022-08-08 LAB — CBC WITH DIFFERENTIAL/PLATELET
Abs Immature Granulocytes: 0.01 10*3/uL (ref 0.00–0.07)
Basophils Absolute: 0 10*3/uL (ref 0.0–0.1)
Basophils Relative: 0 %
Eosinophils Absolute: 0.1 10*3/uL (ref 0.0–0.5)
Eosinophils Relative: 1 %
HCT: 36.9 % (ref 36.0–46.0)
Hemoglobin: 12.7 g/dL (ref 12.0–15.0)
Immature Granulocytes: 0 %
Lymphocytes Relative: 46 %
Lymphs Abs: 3.1 10*3/uL (ref 0.7–4.0)
MCH: 30.5 pg (ref 26.0–34.0)
MCHC: 34.4 g/dL (ref 30.0–36.0)
MCV: 88.5 fL (ref 80.0–100.0)
Monocytes Absolute: 0.4 10*3/uL (ref 0.1–1.0)
Monocytes Relative: 5 %
Neutro Abs: 3.2 10*3/uL (ref 1.7–7.7)
Neutrophils Relative %: 48 %
Platelets: 219 10*3/uL (ref 150–400)
RBC: 4.17 MIL/uL (ref 3.87–5.11)
RDW: 12.8 % (ref 11.5–15.5)
WBC: 6.8 10*3/uL (ref 4.0–10.5)
nRBC: 0 % (ref 0.0–0.2)

## 2022-08-08 LAB — CBG MONITORING, ED
Glucose-Capillary: 80 mg/dL (ref 70–99)
Glucose-Capillary: 56 mg/dL — ABNORMAL LOW (ref 70–99)
Glucose-Capillary: 63 mg/dL — ABNORMAL LOW (ref 70–99)

## 2022-08-08 LAB — HCG, QUANTITATIVE, PREGNANCY: hCG, Beta Chain, Quant, S: <1 m[IU]/mL (ref <5)

## 2022-08-08 MED ORDER — IOHEXOL 300 MG/ML  SOLN
100.0000 mL | Freq: Once | INTRAMUSCULAR | Status: AC | PRN
  Administered 2022-08-08: 100 mL via INTRAVENOUS

## 2022-08-08 NOTE — ED Provider Notes (Signed)
[facility] Provider Note    Event Date/Time   First MD Initiated Contact with Patient 08/08/22 1942     (approximate)   History   Hypoglycemia   HPI {Remember to add pertinent medical, surgical, social, and/or OB history to HPI:1} Laura Mitchell is a 47 y.o. female  ***       Physical Exam   Triage Vital Signs: ED Triage Vitals [08/08/22 1817]  Encounter Vitals Group     BP 126/88     Systolic BP Percentile      Diastolic BP Percentile      Pulse Rate 79     Resp 18     Temp 98.1 F (36.7 C)     Temp Source Oral     SpO2 100 %     Weight 163 lb (73.9 kg)     Height 5\' 3"  (1.6 m)     Head Circumference      Peak Flow      Pain Score 6     Pain Loc      Pain Education      Exclude from Growth Chart     Most recent vital signs: Vitals:   08/08/22 1817  BP: 126/88  Pulse: 79  Resp: 18  Temp: 98.1 F (36.7 C)  SpO2: 100%    {Only need to document appropriate and relevant physical exam:1} General: Awake, no distress. *** CV:  Good peripheral perfusion. *** Resp:  Normal effort. *** Abd:  No distention. *** Other:  ***   ED Results / Procedures / Treatments   Labs (all labs ordered are listed, but only abnormal results are displayed) Labs Reviewed  COMPREHENSIVE METABOLIC PANEL - Abnormal; Notable for the following components:      Result Value   Calcium 8.7 (*)    All other components within normal limits  CBC WITH DIFFERENTIAL/PLATELET  HEMOGLOBIN A1C  CBG MONITORING, ED  POC URINE PREG, ED     EKG  ***   RADIOLOGY *** {USE THE WORD "INTERPRETED"!! You MUST document your own interpretation of imaging, as well as the fact that you reviewed the radiologist's report!:1}   PROCEDURES:  Critical Care performed: {CriticalCareYesNo:19197::"Yes, see critical care procedure note(s)","No"}  Procedures   MEDICATIONS ORDERED IN ED: Medications - No data to display   IMPRESSION / MDM / ASSESSMENT AND PLAN /  ED COURSE  I reviewed the triage vital signs and the nursing notes.                              Differential diagnosis includes, but is not limited to, ***  Patient's presentation is most consistent with {EM COPA:27473}  *** {If the patient is on the monitor, remove the brackets and asterisks on the sentence below and remember to document it as a Procedure as well. Otherwise delete the sentence below:1} {**The patient is on the cardiac monitor to evaluate for evidence of arrhythmia and/or significant heart rate changes.**} {Remember to include, when applicable, any/all of the following data: independent review of imaging independent review of labs (comment specifically on pertinent positives and negatives) review of specific prior hospitalizations, PCP/specialist notes, etc. discuss meds given and prescribed document any discussion with consultants (including hospitalists) any clinical decision tools you used and why (PECARN, NEXUS, etc.) did you consider admitting the patient? document social determinants of health affecting patient's care (homelessness, inability to follow up in a timely fashion,  etc) document any pre-existing conditions increasing risk on current visit (e.g. diabetes and HTN increasing danger of high-risk chest pain/ACS) describes what meds you gave (especially parenteral) and why any other interventions?:1}     FINAL CLINICAL IMPRESSION(S) / ED DIAGNOSES   Final diagnoses:  None     Rx / DC Orders   ED Discharge Orders     None        Note:  This document was prepared using Dragon voice recognition software and may include unintentional dictation errors.

## 2022-08-08 NOTE — Discharge Instructions (Signed)
Please do not use any further Mounjaro until discussing dosing and potential medication changes with Dr. Tedd Sias. Call her Monday  Do not use any insulin this evening.  If your blood sugars are consistently running above 200 throughout the day tomorrow you may resume your long-acting insulin but at half of your current dose.  Continue to monitor your blood sugars very closely as you have to and done so in the last 2 weeks.  Return to the ER right away if you develop confusion, weakness, passout, or unable to get your blood sugar into a normal range with eating and food, or other concerns arise.

## 2022-08-08 NOTE — ED Triage Notes (Signed)
Patient states hypoglycemia for 2 weeks; also complaining of pain to right pelvic area.

## 2022-08-08 NOTE — ED Notes (Signed)
Pt given food and orange juice at this time for blood sugar of 56

## 2022-08-08 NOTE — ED Provider Notes (Signed)
[facility] Provider Note    Event Date/Time   First MD Initiated Contact with Patient 08/08/22 1942     (approximate)   History   Hypoglycemia   HPI  Laura Mitchell is a 47 y.o. female with a insulin-dependent diabetic  Patient reports that about 3 weeks ago she developed a sudden but mild pain deep in her right lower pelvis, shortly thereafter she had some irregular vaginal spotting that does not correlate with her normal periods.  The bleeding resolved after couple of days and was very light, and she still has a mild but persistent discomfort in her right lower pelvis.  No fevers chills nausea or vomiting no chest pain or shortness of breath.  She also relates unsure if it is related or not better over the last 2 weeks or so her blood glucoses have been running frequently low.  She consulted with her endocrinologist and was advised to decrease her Tresiba dose from 48 units to 38 but despite that continues to have low blood glucoses down into the 50s at least a couple times daily she is having to load meals before she goes to bed in order to avoid low glucoses.  She also reports she is having to snack pretty much constantly throughout the day for the last 2 weeks to avoid getting her glucose less than 60.       Physical Exam   Triage Vital Signs: ED Triage Vitals [08/08/22 1817]  Encounter Vitals Group     BP 126/88     Systolic BP Percentile      Diastolic BP Percentile      Pulse Rate 79     Resp 18     Temp 98.1 F (36.7 C)     Temp Source Oral     SpO2 100 %     Weight 163 lb (73.9 kg)     Height 5\' 3"  (1.6 m)     Head Circumference      Peak Flow      Pain Score 6     Pain Loc      Pain Education      Exclude from Growth Chart     Most recent vital signs: Vitals:   08/08/22 2251 08/08/22 2254  BP: (!) 116/54   Pulse: 74   Resp: 16   Temp:  97.7 F (36.5 C)  SpO2: 100%      General: Awake, no distress.  Moist mucous  membranes CV:  Good peripheral perfusion.  Normal tones and rate Resp:  Normal effort.  Clear bilateral Abd:  No distention.  Mild discomfort reported in the right lower pelvic region no rebound or guarding.  Abdomen soft nontender nondistended throughout with exception to slight discomfort in right lower quadrant. Other:     ED Results / Procedures / Treatments   Labs (all labs ordered are listed, but only abnormal results are displayed) Labs Reviewed  COMPREHENSIVE METABOLIC PANEL - Abnormal; Notable for the following components:      Result Value   Calcium 8.7 (*)    All other components within normal limits  CBG MONITORING, ED - Abnormal; Notable for the following components:   Glucose-Capillary 56 (*)    All other components within normal limits  CBG MONITORING, ED - Abnormal; Notable for the following components:   Glucose-Capillary 63 (*)    All other components within normal limits  CBC WITH DIFFERENTIAL/PLATELET  HCG, QUANTITATIVE, PREGNANCY  HEMOGLOBIN A1C  POC URINE PREG, ED  CBG MONITORING, ED  CBG MONITORING, ED  CBG MONITORING, ED  CBG MONITORING, ED  CBG MONITORING, ED  CBG MONITORING, ED  CBG MONITORING, ED   CT ABDOMEN PELVIS W CONTRAST  Addendum Date: 08/08/2022   ADDENDUM REPORT: 08/08/2022 23:53 COMPARISON:  CT abdomen pelvis 10/04/2019. Electronically Signed   By: Tish Frederickson M.D.   On: 08/08/2022 23:53   Result Date: 08/08/2022 CLINICAL DATA:  RLQ abdominal pain Patient states hypoglycemia for 2 weeks; also complaining of pain to right pelvic area. EXAM: CT ABDOMEN AND PELVIS WITH CONTRAST TECHNIQUE: Multidetector CT imaging of the abdomen and pelvis was performed using the standard protocol following bolus administration of intravenous contrast. RADIATION DOSE REDUCTION: This exam was performed according to the departmental dose-optimization program which includes automated exposure control, adjustment of the mA and/or kV according to patient size and/or  use of iterative reconstruction technique. CONTRAST:  OMNIPAQUE IOHEXOL 300 MG/ML  SOLN COMPARISON:  None Available. FINDINGS: Lower chest: No acute abnormality. Hepatobiliary: No focal liver abnormality. Status post cholecystectomy. No biliary dilatation. Pancreas: No focal lesion. Normal pancreatic contour. No surrounding inflammatory changes. No main pancreatic ductal dilatation. Spleen: Normal in size without focal abnormality. Adrenals/Urinary Tract: No adrenal nodule bilaterally. Bilateral kidneys enhance symmetrically. No hydronephrosis. No hydroureter. The urinary bladder is unremarkable. Stomach/Bowel: Small bowel malrotation. Stomach is within normal limits. No evidence of bowel wall thickening or dilatation. Appendix appears normal. Vascular/Lymphatic: No abdominal aorta or iliac aneurysm. Mild atherosclerotic plaque of the aorta and its branches. No abdominal, pelvic, or inguinal lymphadenopathy. Reproductive: Uterus and bilateral adnexa are unremarkable. Other: No intraperitoneal free fluid. No intraperitoneal free gas. No organized fluid collection. Musculoskeletal: No abdominal wall hernia or abnormality. No suspicious lytic or blastic osseous lesions. No acute displaced fracture. L3-L4 posterior disc osteophyte complex formation IMPRESSION: 1. No acute intra-abdominal or intrapelvic abnormality. 2. Incidentally noted small bowel malrotation with no obstruction. 3. Status post cholecystectomy. Electronically Signed: By: Tish Frederickson M.D. On: 08/08/2022 23:37   US PELVIC COMPLETE W TRANSVAGINAL AND TORSION R/O  Result Date: 08/08/2022 CLINICAL DATA:  Vaginal bleeding, pelvic pain.  LMP 07/21/2022 EXAM: TRANSABDOMINAL AND TRANSVAGINAL ULTRASOUND OF PELVIS DOPPLER ULTRASOUND OF OVARIES TECHNIQUE: Both transabdominal and transvaginal ultrasound examinations of the pelvis were performed. Transabdominal technique was performed for global imaging of the pelvis including uterus, ovaries, adnexal  regions, and pelvic cul-de-sac. It was necessary to proceed with endovaginal exam following the transabdominal exam to visualize the endometrium and ovaries bilaterally. Color and duplex Doppler ultrasound was utilized to evaluate blood flow to the ovaries. COMPARISON:  None Available. FINDINGS: Uterus Measurements: 6.8 x 3.9 x 4.4 cm = volume: 61 mL. The uterus is anteverted. The cervix is unremarkable. Caesarean section defect noted within the lower anterior uterine segment. No intrauterine masses are seen. Endometrium Thickness: 7 mm.  No focal abnormality visualized. Right ovary Measurements: 2.1 x 1.1 x 1.3 cm = volume: 2 mL. Normal appearance/no adnexal mass. Left ovary Measurements: 3.0 x 2.3 x 2.8 cm = volume: 10 mL. Normal appearance/no adnexal mass. Pulsed Doppler evaluation of both ovaries demonstrates normal low-resistance arterial and venous waveforms. Other findings No abnormal free fluid. IMPRESSION: 1. Normal pelvic sonogram. Electronically Signed   By: Helyn Numbers M.D.   On: 08/08/2022 20:45      PROCEDURES:  Critical Care performed: No  Procedures   MEDICATIONS ORDERED IN ED: Medications  iohexol (OMNIPAQUE) 300 MG/ML solution 100 mL (100 mLs Intravenous Contrast Given 08/08/22  2322)     IMPRESSION / MDM / ASSESSMENT AND PLAN / ED COURSE  I reviewed the triage vital signs and the nursing notes.                              Differential diagnosis includes, but is not limited to, hypoglycemia brought upon by medications, no history of cirrhosis, no evidence of renal failure, she appears well-hydrated, she reports significant weight loss over the last several months after starting Mounjaro, I suspect this along with a combination of her long-acting glucose may be the cause of her symptoms.  There is no evidence of acute active infection.  She does relate an ongoing mild colicky right lower quadrant pain but her workup here quite reassuring  Given the patient reports about 30  pounds of weight loss and her current use of Mounjaro as well as [place] I suspect her weight loss may be driving a need for change in her diabetes regimen.  She has not had any episodes of syncope confusion or severe hypoglycemia, and she has decreased her Tresiba dose by 10 units over the last few days.  I consulted with poison control, and discussed with poison control toxicologist.  Advises with it seems we have excluded multiple concerns for processes outside of her medication regiment that could be causing potential hypoglycemia, in this case it seems most likely this is medication related.  Advise reasonable to have patient withhold her long-acting insulin tonight, no further Mounjaro without discussing with her endocrinologist and have the patient call and discuss her symptoms with her endocrinologist.  Additionally, her symptoms involving the right lower quadrant seem to be suggestive highly of gynecologic nature given the low deep discomfort for 3 weeks and its initial association with vaginal spotting.  No fevers or chills no GI symptoms like nausea vomiting.  No fever.  Relatively reassuring examination.  Will start evaluation with transvaginal ultrasound  Patient's presentation is most consistent with acute complicated illness / injury requiring diagnostic workup.        ----------------------------------------- 8:24 PM on 08/08/2022 ----------------------------------------- Called [place] [facility], discussed episodes of hypoglycemia, patients glucometer readings and recent lows, today's labs, current medications, and concerns of frequent hypoglycemic episodes with poison control team. Recommendations:   Patient's blood glucose 81 at time of decision for disposition.  She monitors closely on her phone.  Currently she is eating comfortable, she is comfortable to plan to not take her long-acting insulin, monitor blood sugars carefully, and if her blood sugar is reading on average 200 or  greater tomorrow she can use half dose of her long-acting insulin.  She will call Dr. Donavan Burnet office Monday.  She will keep food and snacks with her and continue to continue to closely monitor her glucose.  Will return the emergency room right away if worsening symptoms new concerns or ongoing or worsening hypoglycemia.  Follow-up with primary care anticipated  Return precautions and treatment recommendations and follow-up discussed with the patient who is agreeable with the plan.  Patient agreeable to not driving car until episodes of hypoglycemia have been resolved  FINAL CLINICAL IMPRESSION(S) / ED DIAGNOSES   Final diagnoses:  Hypoglycemia  Colicky RLQ abdominal pain     Rx / DC Orders   ED Discharge Orders     None        Note:  This document was prepared using Dragon voice recognition software and may include unintentional dictation errors.  Sharyn Creamer, MD 08/09/22 [phone]

## 2022-08-09 NOTE — ED Notes (Signed)
16 oz of OJ given at this time

## 2022-08-11 DIAGNOSIS — Z794 Long term (current) use of insulin: Secondary | ICD-10-CM | POA: Diagnosis not present

## 2022-08-11 DIAGNOSIS — E11649 Type 2 diabetes mellitus with hypoglycemia without coma: Secondary | ICD-10-CM | POA: Diagnosis not present

## 2022-11-12 DIAGNOSIS — E119 Type 2 diabetes mellitus without complications: Secondary | ICD-10-CM | POA: Diagnosis not present

## 2022-11-17 DIAGNOSIS — R519 Headache, unspecified: Secondary | ICD-10-CM | POA: Diagnosis not present

## 2022-11-17 DIAGNOSIS — E119 Type 2 diabetes mellitus without complications: Secondary | ICD-10-CM | POA: Diagnosis not present

## 2023-02-17 DIAGNOSIS — R300 Dysuria: Secondary | ICD-10-CM | POA: Diagnosis not present

## 2023-03-18 DIAGNOSIS — E119 Type 2 diabetes mellitus without complications: Secondary | ICD-10-CM | POA: Diagnosis not present

## 2023-03-25 DIAGNOSIS — E119 Type 2 diabetes mellitus without complications: Secondary | ICD-10-CM | POA: Diagnosis not present

## 2023-11-22 IMAGING — MR MR CERVICAL SPINE W/O CM
5 series · 35 of 48 positions shown · non-contrast
Comparison: No pertinent prior exams available for comparison.

CLINICAL DATA: Right arm weakness. Additional history provided by
scanning technologist: Patient reports right arm weakness and
numbness from elbow to shoulder. Symptoms for 6 months.

EXAM:
MRI CERVICAL SPINE WITHOUT CONTRAST
TECHNIQUE: Multiplanar, multisequence MR imaging of the cervical spine was
performed. No intravenous contrast was administered.

[Series 2: T2 · sagittal · 3.0mm · 0.41mm/px · 8 of 17 slices shown (1 of 2)]
[im 1/17]
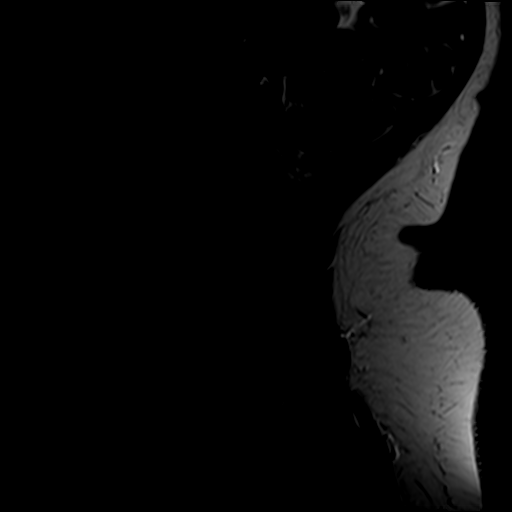
[im 3/17]
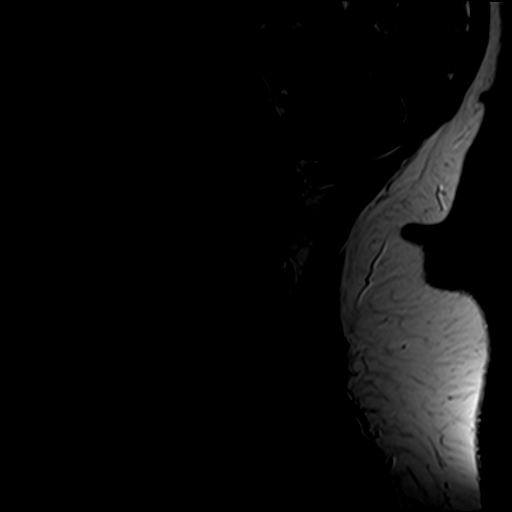
[im 5/17]
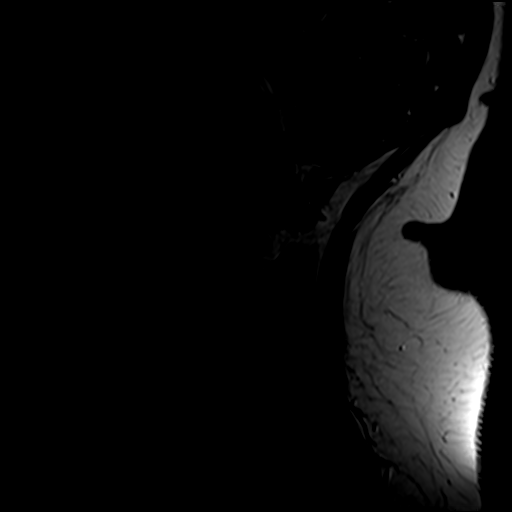
[im 7/17]
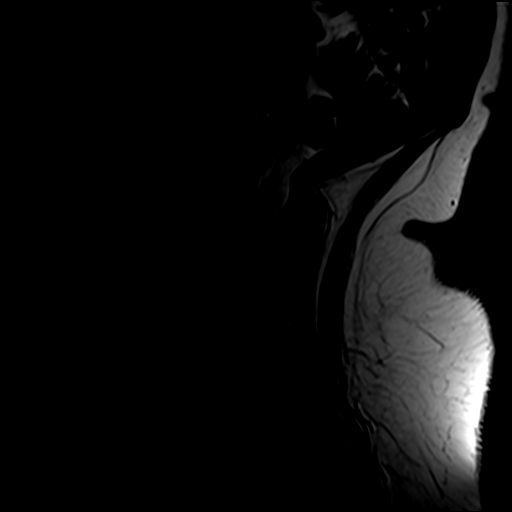
[im 10/17]
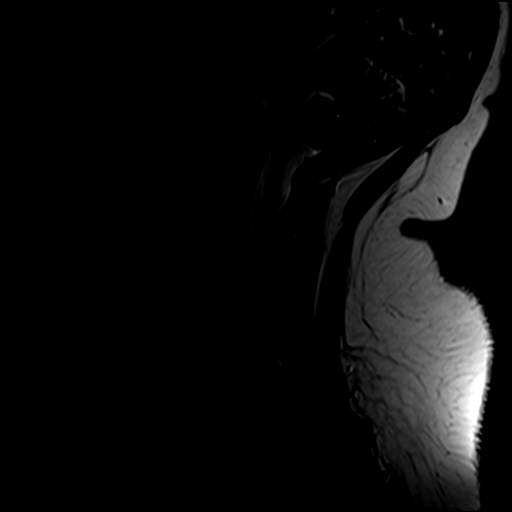
[im 12/17]
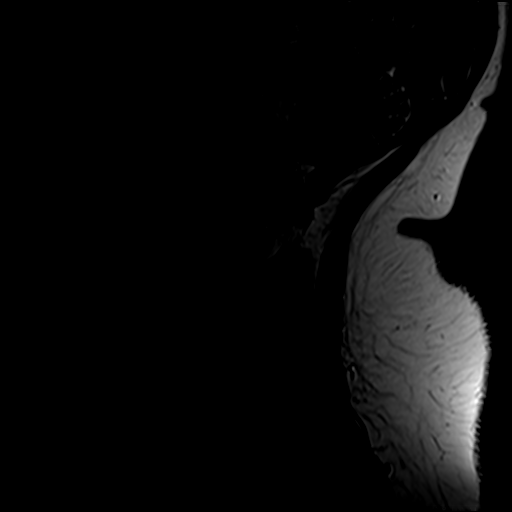
[im 14/17]
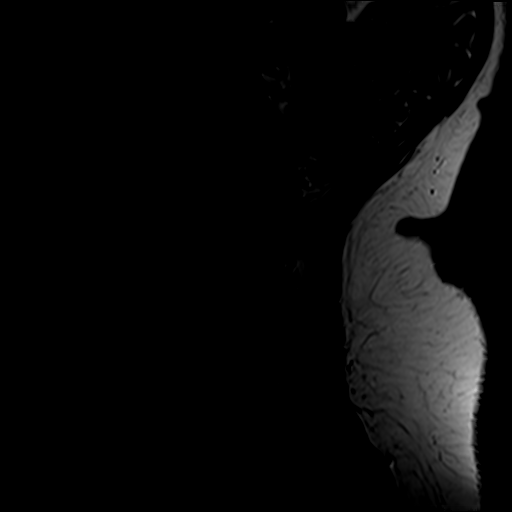
[im 17/17]
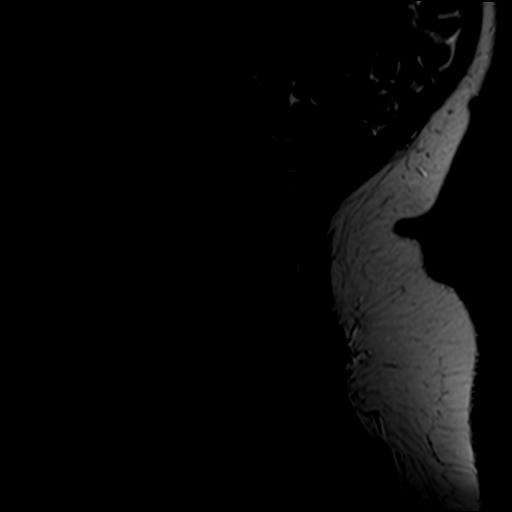

[Series 3: STIR · sagittal · 3.0mm · 0.82mm/px · 8 of 17 slices shown]
[im 1/17]
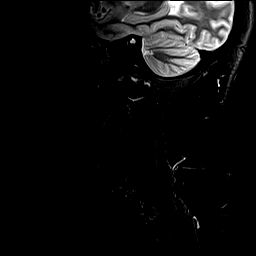
[im 3/17]
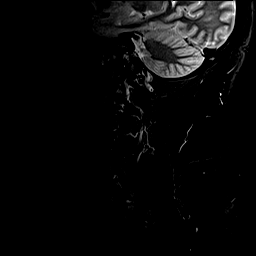
[im 5/17]
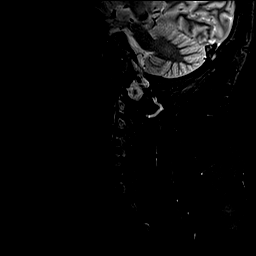
[im 7/17]
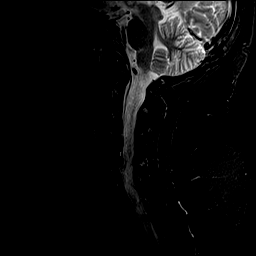
[im 10/17]
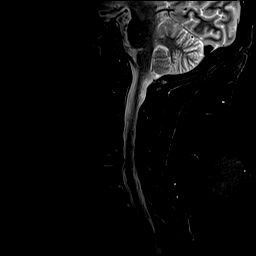
[im 12/17]
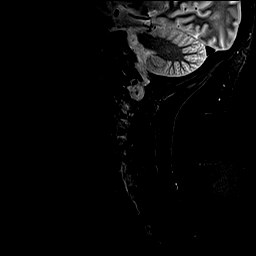
[im 14/17]
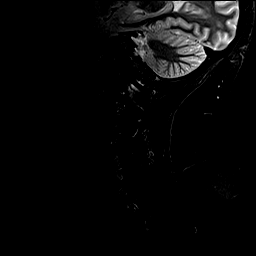
[im 17/17]
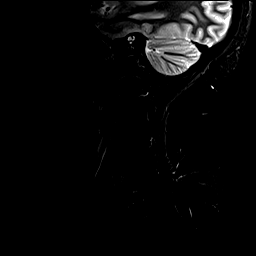

[Series 4: T1 · sagittal · 3.0mm · 0.82mm/px · 8 of 17 slices shown]
[im 1/17]
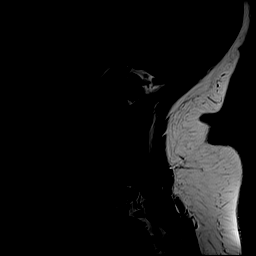
[im 3/17]
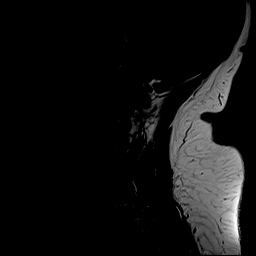
[im 5/17]
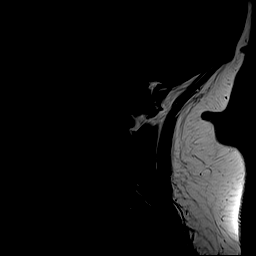
[im 7/17]
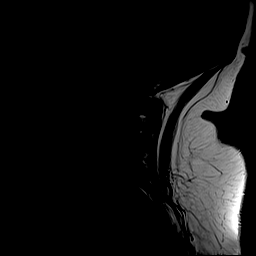
[im 10/17]
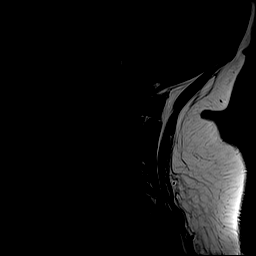
[im 12/17]
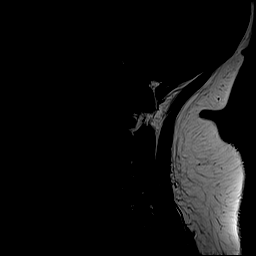
[im 14/17]
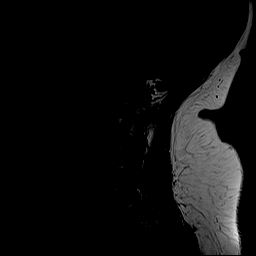
[im 17/17]
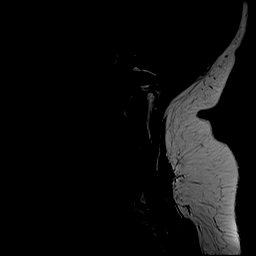

[Series 5: T2 · axial · 3.0mm · 0.70mm/px · z∈[-97,-8]mm · 9 of 25 slices shown (2 of 2)]
[im 1/25]
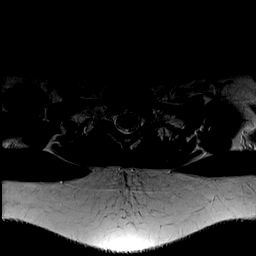
[im 5/25]
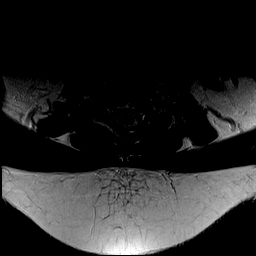
[im 7/25]
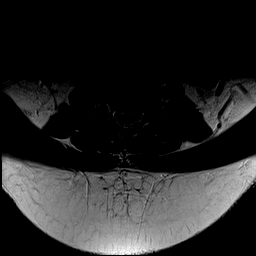
[im 11/25]
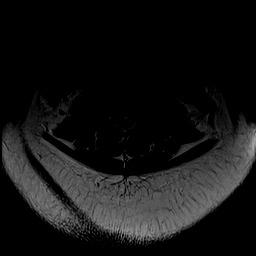
[im 14/25]
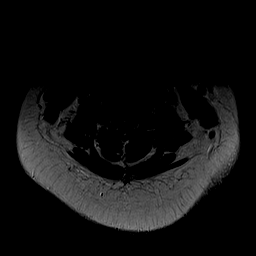
[im 18/25]
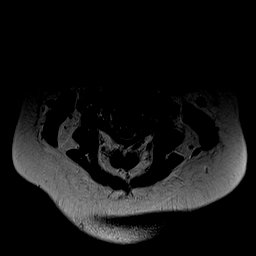
[im 20/25]
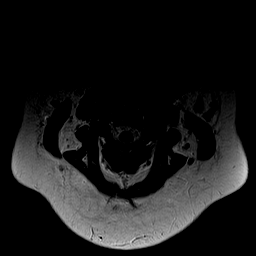
[im 22/25]
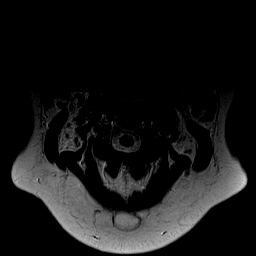
[im 25/25]
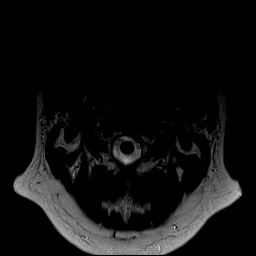

[Series 6: GRE · axial · 3.0mm · 0.35mm/px · z∈[-97,-82]mm · 2 of 25 slices shown]
[im 1/25]
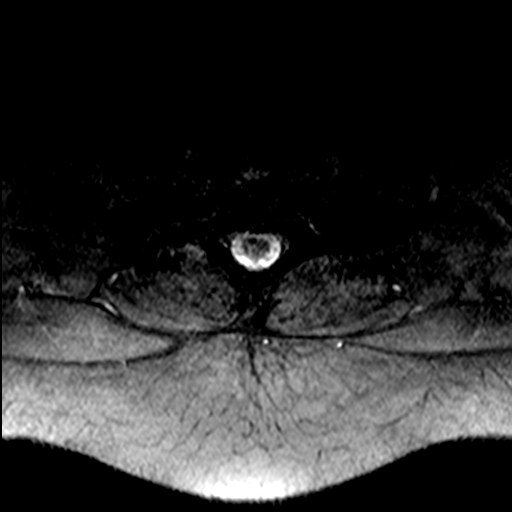
[im 5/25]
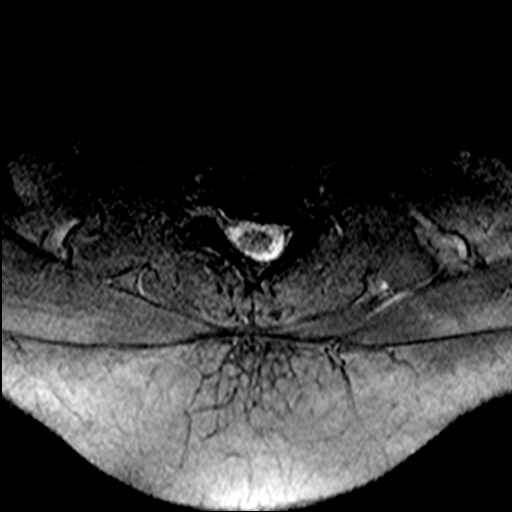

[35 of 48 positions shown; findings below may reference images not displayed]

FINDINGS: Intermittently motion degraded examination. Most notably, there is
moderate motion degradation of the axial T2 TSE sequence.

Alignment: Straightening of the expected cervical lordosis. No
significant spondylolisthesis.

Vertebrae: Vertebral body height is maintained. Mild degenerative
endplate edema at C5-C6.

Cord: Within the limitations of motion degradation, no signal
abnormality is identified within the cervical spinal cord.

Posterior Fossa, vertebral arteries, paraspinal tissues: No
abnormality identified within included portions of the posterior
fossa. Flow voids preserved within the imaged cervical vertebral
arteries. Paraspinal soft tissues unremarkable.

Disc levels:

Mild-to-moderate disc degeneration at C5-C6 and C7-T1. No more than
mild disc degeneration at the remaining levels.

C2-C3: No significant disc herniation or stenosis.

C3-C4: No significant disc herniation or stenosis.

C4-C5: Shallow disc bulge. Uncovertebral hypertrophy on the right.
Minimal partial effacement of the ventral thecal sac (without spinal
cord mass effect). Mild right neural foraminal narrowing.

C5-C6: Disc bulge with right-sided disc osteophyte ridge/uncinate
hypertrophy. The disc bulge partially effaces the ventral thecal
sac, resulting in mild spinal canal stenosis (without spinal cord
mass effect). Mild to moderate right neural foraminal narrowing.

C6-C7: No significant disc herniation or spinal canal stenosis. Mild
uncovertebral hypertrophy on the right resulting in mild relative
right neural foraminal narrowing.

C7-T1: Tiny central disc protrusion. No significant spinal canal or
foraminal stenosis.
IMPRESSION: Intermittently motion degraded examination.

Cervical spondylosis, as outlined.

Foraminal stenosis on the right at C4-C5 (mild), on the right at
C5-C6 (mild-to-moderate) and on the right at C6-C7 (mild).

No more than mild spinal canal narrowing.

Disc degeneration is greatest at C5-C6 and C7-T1 (mild to moderate
at these levels). Associated mild degenerative endplate edema at
C5-C6.

Nonspecific straightening of the expected cervical lordosis.
